# Patient Record
Sex: Male | Born: 1960 | Race: Black or African American | Hispanic: No | Smoking: Current some day smoker
Health system: Southern US, Community
[De-identification: ages and names within clinical notes are randomized; demographics above are authoritative.]

## PROBLEM LIST (undated history)

## (undated) DIAGNOSIS — E119 Type 2 diabetes mellitus without complications: Secondary | ICD-10-CM

## (undated) DIAGNOSIS — F32A Depression, unspecified: Secondary | ICD-10-CM

## (undated) DIAGNOSIS — I10 Essential (primary) hypertension: Secondary | ICD-10-CM

## (undated) DIAGNOSIS — G4730 Sleep apnea, unspecified: Secondary | ICD-10-CM

## (undated) HISTORY — PX: HAND SURGERY: SHX662

## (undated) HISTORY — PX: HEMORRHOID SURGERY: SHX153

---

## 2017-11-25 ENCOUNTER — Encounter (HOSPITAL_COMMUNITY): Payer: Self-pay | Admitting: Emergency Medicine

## 2017-11-25 ENCOUNTER — Other Ambulatory Visit: Payer: Self-pay

## 2017-11-25 ENCOUNTER — Emergency Department (HOSPITAL_COMMUNITY)
Admission: EM | Admit: 2017-11-25 | Discharge: 2017-11-26 | Disposition: A | Discharge status: Home / Self Care | Payer: Medicaid Other | Attending: Emergency Medicine | Admitting: Emergency Medicine

## 2017-11-25 DIAGNOSIS — I10 Essential (primary) hypertension: Secondary | ICD-10-CM | POA: Diagnosis not present

## 2017-11-25 DIAGNOSIS — R112 Nausea with vomiting, unspecified: Secondary | ICD-10-CM | POA: Diagnosis present

## 2017-11-25 DIAGNOSIS — R197 Diarrhea, unspecified: Secondary | ICD-10-CM | POA: Diagnosis not present

## 2017-11-25 DIAGNOSIS — F17200 Nicotine dependence, unspecified, uncomplicated: Secondary | ICD-10-CM | POA: Insufficient documentation

## 2017-11-25 DIAGNOSIS — E119 Type 2 diabetes mellitus without complications: Secondary | ICD-10-CM | POA: Insufficient documentation

## 2017-11-25 HISTORY — DX: Essential (primary) hypertension: I10

## 2017-11-25 HISTORY — DX: Type 2 diabetes mellitus without complications: E11.9

## 2017-11-25 HISTORY — DX: Sleep apnea, unspecified: G47.30

## 2017-11-25 LAB — URINALYSIS, ROUTINE W REFLEX MICROSCOPIC
BILIRUBIN URINE: NEGATIVE
Glucose, UA: 50 mg/dL — AB
HGB URINE DIPSTICK: NEGATIVE
Ketones, ur: 5 mg/dL — AB
LEUKOCYTES UA: NEGATIVE
Nitrite: NEGATIVE
PH: 5 (ref 5.0–8.0)
Protein, ur: 100 mg/dL — AB
SPECIFIC GRAVITY, URINE: 1.035 — AB (ref 1.005–1.030)

## 2017-11-25 LAB — COMPREHENSIVE METABOLIC PANEL
ALK PHOS: 82 U/L (ref 38–126)
ALT: 37 U/L (ref 17–63)
AST: 49 U/L — AB (ref 15–41)
Albumin: 3.8 g/dL (ref 3.5–5.0)
Anion gap: 14 (ref 5–15)
BILIRUBIN TOTAL: 0.8 mg/dL (ref 0.3–1.2)
BUN: 17 mg/dL (ref 6–20)
CALCIUM: 8.4 mg/dL — AB (ref 8.9–10.3)
CO2: 21 mmol/L — ABNORMAL LOW (ref 22–32)
CREATININE: 1.18 mg/dL (ref 0.61–1.24)
Chloride: 100 mmol/L — ABNORMAL LOW (ref 101–111)
Glucose, Bld: 276 mg/dL — ABNORMAL HIGH (ref 65–99)
Potassium: 3.5 mmol/L (ref 3.5–5.1)
Sodium: 135 mmol/L (ref 135–145)
Total Protein: 8 g/dL (ref 6.5–8.1)

## 2017-11-25 LAB — CBC
HCT: 47.6 % (ref 39.0–52.0)
Hemoglobin: 16.1 g/dL (ref 13.0–17.0)
MCH: 32.5 pg (ref 26.0–34.0)
MCHC: 33.8 g/dL (ref 30.0–36.0)
MCV: 96.2 fL (ref 78.0–100.0)
PLATELETS: 231 10*3/uL (ref 150–400)
RBC: 4.95 MIL/uL (ref 4.22–5.81)
RDW: 12.7 % (ref 11.5–15.5)
WBC: 9.8 10*3/uL (ref 4.0–10.5)

## 2017-11-25 LAB — LIPASE, BLOOD: Lipase: 30 U/L (ref 11–51)

## 2017-11-25 MED ORDER — ONDANSETRON 4 MG PO TBDP
8.0000 mg | ORAL_TABLET | Freq: Once | ORAL | Status: AC
  Administered 2017-11-26: 8 mg via ORAL
  Filled 2017-11-25: qty 2

## 2017-11-25 NOTE — ED Provider Notes (Signed)
Patient placed in Quick Look pathway, seen and evaluated   Chief Complaint: nausea, vomiting, abdominal pain, diarrhea  HPI:   Pt reports two days of nausea, vomiting, diarrhea, abdominal pain. Unable to keep anything down. Tried theraflu, but states he threw that up too. No sick contacts. Reports subjective fever and chills. No urinary symptoms. No URI symptoms.   ROS: positive for abd pain, nausea, vomiting, diarrhea, chills.   Physical Exam:   Gen: No distress  Neuro: Awake and Alert  Skin: Warm    Focused Exam: diffuse abd tenderness, worse in epigastric area and rid mid quadrant.   Pt with n/v/d/abdominal pain. Pt is tachycardic, appears dry. Abdomen diffusely tender, but no guarding or rebound tenderness. Labs ordered. Urine ordered. Will give zofran for now, until able to be re evaluated in the room.   Vitals:   11/25/17 1929  BP: (!) 150/98  Pulse: (!) 113  Resp: 20  Temp: 99.2 F (37.3 C)  TempSrc: Oral  SpO2: 97%  Weight: (!) 144.7 kg (319 lb)  Height: 5\' 8"  (1.727 m)      Initiation of care has begun. The patient has been counseled on the process, plan, and necessity for staying for the completion/evaluation, and the remainder of the medical screening examination    Iona CoachKirichenko, Kely Dohn, PA-C 11/25/17 Bennye [id number]    Campos, Kevin, MD 11/25/17 2251

## 2017-11-25 NOTE — ED Triage Notes (Signed)
Pt c/o nausea/vomiting/diarrhea and RLQ pain since Saturday. Reports he has been unable to keep anything down.

## 2017-11-26 MED ORDER — SODIUM CHLORIDE 0.9 % IV BOLUS (SEPSIS)
1000.0000 mL | Freq: Once | INTRAVENOUS | Status: AC
  Administered 2017-11-26: 1000 mL via INTRAVENOUS

## 2017-11-26 MED ORDER — LOPERAMIDE HCL 2 MG PO CAPS
4.0000 mg | ORAL_CAPSULE | Freq: Once | ORAL | Status: AC
  Administered 2017-11-26: 4 mg via ORAL
  Filled 2017-11-26: qty 2

## 2017-11-26 MED ORDER — ONDANSETRON HCL 4 MG/2ML IJ SOLN
4.0000 mg | Freq: Once | INTRAMUSCULAR | Status: AC
  Administered 2017-11-26: 4 mg via INTRAVENOUS
  Filled 2017-11-26: qty 2

## 2017-11-26 MED ORDER — ONDANSETRON 4 MG PO TBDP: 4.0000 mg | ORAL_TABLET | Freq: Three times a day (TID) | ORAL | 0 refills | Status: DC | PRN

## 2017-11-26 MED ORDER — LOPERAMIDE HCL 2 MG PO CAPS: 2.0000 mg | ORAL_CAPSULE | Freq: Four times a day (QID) | ORAL | 0 refills | Status: DC | PRN

## 2017-11-26 NOTE — ED Provider Notes (Signed)
[facility] EMERGENCY DEPARTMENT Provider Note   CSN: [id number] Arrival date & time: 11/25/17  1834     History   Chief Complaint Chief Complaint  Patient presents with  . Emesis    HPI Jesse Whitehead is a 57 y.o. male.  HPI  This is a 57 year old male with a history of diabetes, hypertension who presents with nausea, vomiting, diarrhea.  Patient reports onset of symptoms on Saturday.  He reports diffuse crampy abdominal pain.  He reports nonbilious, nonbloody emesis and nonbloody diarrhea.  He states "I cannot keep anything down either vomited up or it comes out the bottom."  He cannot quantify his abdominal pain he states "it is just crampy."  He denies any recent fevers, upper respiratory symptoms, cough, chest pain, shortness of breath.  No known sick contacts.  He does have a history of cholecystectomy.  Past Medical History:  Diagnosis Date  . Diabetes mellitus without complication (HCC)   . Hypertension   . Sleep apnea     There are no active problems to display for this patient.   History reviewed. No pertinent surgical history.     Home Medications    Prior to Admission medications   Medication Sig Start Date End Date Taking? Authorizing Provider  loperamide (IMODIUM) 2 MG capsule Take 1 capsule (2 mg total) by mouth 4 (four) times daily as needed for diarrhea or loose stools. 11/26/17   Denys Salinger, Mayer Masker, MD  ondansetron (ZOFRAN ODT) 4 MG disintegrating tablet Take 1 tablet (4 mg total) by mouth every 8 (eight) hours as needed for nausea or vomiting. 11/26/17   Ethel Veronica, Mayer Masker, MD    Family History No family history on file.  Social History Social History   Tobacco Use  . Smoking status: Current Every Day Smoker  . Smokeless tobacco: Never Used  Substance Use Topics  . Alcohol use: Yes    Frequency: Never  . Drug use: No     Allergies   Patient has no known allergies.   Review of Systems Review of Systems    Constitutional: Negative for fever.  HENT: Negative for congestion.   Respiratory: Negative for shortness of breath.   Cardiovascular: Negative for chest pain.  Gastrointestinal: Positive for abdominal pain, diarrhea, nausea and vomiting.  Genitourinary: Negative for dysuria.  Neurological: Negative for headaches.  All other systems reviewed and are negative.    Physical Exam Updated Vital Signs BP (!) 135/91 (BP Location: Right Arm)   Pulse (!) 104   Temp 99.2 F (37.3 C) (Oral)   Resp 20   Ht 5\' 8"  (1.727 m)   Wt (!) 144.7 kg (319 lb)   SpO2 97%   BMI 48.50 kg/m   Physical Exam  Constitutional: He is oriented to person, place, and time. He appears well-developed and well-nourished.  Obese, no acute distress  HENT:  Head: Normocephalic and atraumatic.  Mucous membranes dry  Cardiovascular: Normal rate, regular rhythm and normal heart sounds.  No murmur heard. Pulmonary/Chest: Effort normal and breath sounds normal. No respiratory distress. He has no wheezes.  Abdominal: Soft. There is tenderness. There is no rebound and no guarding.  Hyperactive bowel sounds, mild diffuse tenderness to palpation, no localized tenderness  Musculoskeletal: He exhibits no edema.  Neurological: He is alert and oriented to person, place, and time.  Skin: Skin is warm and dry.  Psychiatric: He has a normal mood and affect.  Nursing note and vitals reviewed.    ED Treatments /  Results  Labs (all labs ordered are listed, but only abnormal results are displayed) Labs Reviewed  COMPREHENSIVE METABOLIC PANEL - Abnormal; Notable for the following components:      Result Value   Chloride 100 (*)    CO2 21 (*)    Glucose, Bld 276 (*)    Calcium 8.4 (*)    AST 49 (*)    All other components within normal limits  URINALYSIS, ROUTINE W REFLEX MICROSCOPIC - Abnormal; Notable for the following components:   Color, Urine AMBER (*)    APPearance HAZY (*)    Specific Gravity, Urine 1.035 (*)     Glucose, UA 50 (*)    Ketones, ur 5 (*)    Protein, ur 100 (*)    Bacteria, UA RARE (*)    Squamous Epithelial / LPF 0-5 (*)    All other components within normal limits  LIPASE, BLOOD  CBC    EKG  EKG Interpretation None       Radiology No results found.  Procedures Procedures (including critical care time)  Medications Ordered in ED Medications  ondansetron (ZOFRAN-ODT) disintegrating tablet 8 mg (8 mg Oral Given 11/26/17 0106)  ondansetron (ZOFRAN) injection 4 mg (4 mg Intravenous Given 11/26/17 0209)  loperamide (IMODIUM) capsule 4 mg (4 mg Oral Given 11/26/17 0210)  sodium chloride 0.9 % bolus 1,000 mL (1,000 mLs Intravenous New Bag/Given 11/26/17 0125)     Initial Impression / Assessment and Plan / ED Course  I have reviewed the triage vital signs and the nursing notes.  Pertinent labs & imaging results that were available during my care of the patient were reviewed by me and considered in my medical decision making (see chart for details).     She presents with nausea, vomiting, diarrhea.  He is overall nontoxic appearing.  Vital signs notable for heart rate of 104 and blood pressure 135/91.  He does appear somewhat dry.  Physical exam is fairly benign.  He has some mild diffuse tenderness but no signs of peritonitis.  Suspect viral etiology.  He has had prior abdominal surgeries that would put him at risk for bowel obstruction; however he is passing stools and he is nontender on exam.  Lab workup is largely unremarkable.  He does have some hyperglycemia.  No evidence of DKA.  Patient given fluids, Zofran, and Imodium.  On recheck, he states he feels better and is tolerating fluids without difficulty.  Suspect viral etiology.  Supportive measures recommended at home.  After history, exam, and medical workup I feel the patient has been appropriately medically screened and is safe for discharge home. Pertinent diagnoses were discussed with the patient. Patient was given  return precautions.   Final Clinical Impressions(s) / ED Diagnoses   Final diagnoses:  Nausea vomiting and diarrhea    ED Discharge Orders        Ordered    ondansetron (ZOFRAN ODT) 4 MG disintegrating tablet  Every 8 hours PRN     11/26/17 0230    loperamide (IMODIUM) 2 MG capsule  4 times daily PRN     11/26/17 0230       Shon BatonHorton, Colandra Ohanian F, MD 11/26/17 0234

## 2018-12-11 ENCOUNTER — Emergency Department (HOSPITAL_COMMUNITY): Payer: Medicaid Other

## 2018-12-11 ENCOUNTER — Emergency Department (HOSPITAL_COMMUNITY)
Admission: EM | Admit: 2018-12-11 | Discharge: 2018-12-11 | Disposition: A | Discharge status: Home / Self Care | Payer: Medicaid Other | Attending: Emergency Medicine | Admitting: Emergency Medicine

## 2018-12-11 ENCOUNTER — Other Ambulatory Visit: Payer: Self-pay

## 2018-12-11 ENCOUNTER — Encounter (HOSPITAL_COMMUNITY): Payer: Self-pay

## 2018-12-11 DIAGNOSIS — R109 Unspecified abdominal pain: Secondary | ICD-10-CM | POA: Insufficient documentation

## 2018-12-11 DIAGNOSIS — F17210 Nicotine dependence, cigarettes, uncomplicated: Secondary | ICD-10-CM | POA: Diagnosis not present

## 2018-12-11 DIAGNOSIS — I10 Essential (primary) hypertension: Secondary | ICD-10-CM | POA: Diagnosis not present

## 2018-12-11 DIAGNOSIS — E119 Type 2 diabetes mellitus without complications: Secondary | ICD-10-CM | POA: Insufficient documentation

## 2018-12-11 LAB — CBC WITH DIFFERENTIAL/PLATELET
ABS IMMATURE GRANULOCYTES: 0.03 10*3/uL (ref 0.00–0.07)
Basophils Absolute: 0 10*3/uL (ref 0.0–0.1)
Basophils Relative: 0 %
EOS ABS: 0.1 10*3/uL (ref 0.0–0.5)
Eosinophils Relative: 1 %
HEMATOCRIT: 48.7 % (ref 39.0–52.0)
Hemoglobin: 15.8 g/dL (ref 13.0–17.0)
IMMATURE GRANULOCYTES: 0 %
LYMPHS ABS: 2.6 10*3/uL (ref 0.7–4.0)
Lymphocytes Relative: 23 %
MCH: 30.3 pg (ref 26.0–34.0)
MCHC: 32.4 g/dL (ref 30.0–36.0)
MCV: 93.5 fL (ref 80.0–100.0)
Monocytes Absolute: 1.1 10*3/uL — ABNORMAL HIGH (ref 0.1–1.0)
Monocytes Relative: 10 %
NEUTROS ABS: 7.3 10*3/uL (ref 1.7–7.7)
NEUTROS PCT: 66 %
Platelets: 221 10*3/uL (ref 150–400)
RBC: 5.21 MIL/uL (ref 4.22–5.81)
RDW: 12.4 % (ref 11.5–15.5)
WBC: 11.1 10*3/uL — AB (ref 4.0–10.5)
nRBC: 0 % (ref 0.0–0.2)

## 2018-12-11 LAB — COMPREHENSIVE METABOLIC PANEL
ALBUMIN: 3.8 g/dL (ref 3.5–5.0)
ALT: 15 U/L (ref 0–44)
AST: 19 U/L (ref 15–41)
Alkaline Phosphatase: 70 U/L (ref 38–126)
Anion gap: 8 (ref 5–15)
BILIRUBIN TOTAL: 0.8 mg/dL (ref 0.3–1.2)
BUN: 9 mg/dL (ref 6–20)
CALCIUM: 9.6 mg/dL (ref 8.9–10.3)
CO2: 27 mmol/L (ref 22–32)
CREATININE: 1 mg/dL (ref 0.61–1.24)
Chloride: 100 mmol/L (ref 98–111)
GFR calc Af Amer: >60 mL/min (ref >60)
GLUCOSE: 199 mg/dL — AB (ref 70–99)
POTASSIUM: 4 mmol/L (ref 3.5–5.1)
Sodium: 135 mmol/L (ref 135–145)
TOTAL PROTEIN: 7.5 g/dL (ref 6.5–8.1)

## 2018-12-11 LAB — URINALYSIS, ROUTINE W REFLEX MICROSCOPIC
BILIRUBIN URINE: NEGATIVE
GLUCOSE, UA: 50 mg/dL — AB
Hgb urine dipstick: NEGATIVE
KETONES UR: NEGATIVE mg/dL
LEUKOCYTE UA: NEGATIVE
Nitrite: NEGATIVE
PH: 5 (ref 5.0–8.0)
PROTEIN: NEGATIVE mg/dL
Specific Gravity, Urine: 1.021 (ref 1.005–1.030)

## 2018-12-11 LAB — LIPASE, BLOOD: LIPASE: 27 U/L (ref 11–51)

## 2018-12-11 MED ORDER — MORPHINE SULFATE (PF) 4 MG/ML IV SOLN
4.0000 mg | Freq: Once | INTRAVENOUS | Status: AC
  Administered 2018-12-11: 4 mg via INTRAVENOUS
  Filled 2018-12-11: qty 1

## 2018-12-11 MED ORDER — SODIUM CHLORIDE 0.9 % IV BOLUS
1000.0000 mL | Freq: Once | INTRAVENOUS | Status: AC
  Administered 2018-12-11: 1000 mL via INTRAVENOUS

## 2018-12-11 MED ORDER — HYDROCODONE-ACETAMINOPHEN 5-325 MG PO TABS: 1.0000 | ORAL_TABLET | ORAL | 0 refills | Status: DC | PRN

## 2018-12-11 MED ORDER — ONDANSETRON HCL 4 MG/2ML IJ SOLN
4.0000 mg | Freq: Once | INTRAMUSCULAR | Status: AC
  Administered 2018-12-11: 4 mg via INTRAVENOUS
  Filled 2018-12-11: qty 2

## 2018-12-11 MED ORDER — DIAZEPAM 5 MG PO TABS: 5.0000 mg | ORAL_TABLET | Freq: Two times a day (BID) | ORAL | 0 refills | Status: DC

## 2018-12-11 MED ORDER — HYDROMORPHONE HCL 1 MG/ML IJ SOLN
1.0000 mg | Freq: Once | INTRAMUSCULAR | Status: AC
  Administered 2018-12-11: 1 mg via INTRAVENOUS
  Filled 2018-12-11: qty 1

## 2018-12-11 NOTE — ED Notes (Signed)
Patient returned back from CT. 

## 2018-12-11 NOTE — ED Provider Notes (Signed)
[facility] EMERGENCY DEPARTMENT Provider Note   CSN: [id number] Arrival date & time: 12/11/18  6578    History   Chief Complaint Chief Complaint  Patient presents with  . Flank Pain    HPI Esther Broyles is a 58 y.o. male.     Pt presents to the ED today with right sided flank pain.  Pt said it started last night. No n/v.  He said it feels like his gallbladder, but that is gone.  No hx of kidney stones.     Past Medical History:  Diagnosis Date  . Diabetes mellitus without complication (HCC)   . Hypertension   . Sleep apnea     There are no active problems to display for this patient.   History reviewed. No pertinent surgical history.      Home Medications    Prior to Admission medications   Medication Sig Start Date End Date Taking? Authorizing Provider  diazepam (VALIUM) 5 MG tablet Take 1 tablet (5 mg total) by mouth 2 (two) times daily. 12/11/18   Jacalyn Lefevre, MD  HYDROcodone-acetaminophen (NORCO/VICODIN) 5-325 MG tablet Take 1 tablet by mouth every 4 (four) hours as needed. 12/11/18   Jacalyn Lefevre, MD  loperamide (IMODIUM) 2 MG capsule Take 1 capsule (2 mg total) by mouth 4 (four) times daily as needed for diarrhea or loose stools. 11/26/17   Horton, Mayer Masker, MD  ondansetron (ZOFRAN ODT) 4 MG disintegrating tablet Take 1 tablet (4 mg total) by mouth every 8 (eight) hours as needed for nausea or vomiting. 11/26/17   Horton, Mayer Masker, MD    Family History History reviewed. No pertinent family history.  Social History Social History   Tobacco Use  . Smoking status: Current Every Day Smoker    Packs/day: 0.25    Types: Cigarettes  . Smokeless tobacco: Never Used  Substance Use Topics  . Alcohol use: Yes    Frequency: Never    Comment: occ  . Drug use: No     Allergies   Patient has no known allergies.   Review of Systems Review of Systems  Genitourinary: Positive for flank pain.  All other systems reviewed and  are negative.    Physical Exam Updated Vital Signs BP (!) 148/92 (BP Location: Right Arm)   Pulse 92   Temp 98.4 F (36.9 C) (Oral)   Resp 20   Ht  (1.727 m)   Wt 136.1 kg   SpO2 95%   BMI 45.61 kg/m   Physical Exam Vitals signs and nursing note reviewed.  Constitutional:      Appearance: Normal appearance. He is obese.  HENT:     Head: Normocephalic and atraumatic.     Right Ear: External ear normal.     Left Ear: External ear normal.     Nose: Nose normal.     Mouth/Throat:     Mouth: Mucous membranes are moist.  Eyes:     Extraocular Movements: Extraocular movements intact.     Pupils: Pupils are equal, round, and reactive to light.  Neck:     Musculoskeletal: Normal range of motion and neck supple.  Cardiovascular:     Rate and Rhythm: Normal rate and regular rhythm.     Pulses: Normal pulses.  Pulmonary:     Effort: Pulmonary effort is normal.     Breath sounds: Normal breath sounds.  Abdominal:     General: Abdomen is flat.     Palpations: Abdomen is soft.  Tenderness: There is abdominal tenderness in the right upper quadrant and right lower quadrant.  Musculoskeletal: Normal range of motion.  Skin:    General: Skin is warm.     Capillary Refill: Capillary refill takes less than 2 seconds.  Neurological:     General: No focal deficit present.     Mental Status: He is alert and oriented to person, place, and time.  Psychiatric:        Mood and Affect: Mood normal.        Behavior: Behavior normal.      ED Treatments / Results  Labs (all labs ordered are listed, but only abnormal results are displayed) Labs Reviewed  CBC WITH DIFFERENTIAL/PLATELET - Abnormal; Notable for the following components:      Result Value   WBC 11.1 (*)    Monocytes Absolute 1.1 (*)    All other components within normal limits  COMPREHENSIVE METABOLIC PANEL - Abnormal; Notable for the following components:   Glucose, Bld 199 (*)    All other components within  normal limits  URINALYSIS, ROUTINE W REFLEX MICROSCOPIC - Abnormal; Notable for the following components:   Glucose, UA 50 (*)    All other components within normal limits  LIPASE, BLOOD    EKG None  Radiology Ct Renal Stone Study  Result Date: 12/11/2018 CLINICAL DATA:  Right flank pain since last night. EXAM: CT ABDOMEN AND PELVIS WITHOUT CONTRAST TECHNIQUE: Multidetector CT imaging of the abdomen and pelvis was performed following the standard protocol without IV contrast. COMPARISON:  None. FINDINGS: Lower chest: No acute abnormality. Minimal subsegmental atelectasis in the left lower lobe. Hepatobiliary: Hepatic steatosis. No focal liver abnormality. Status post cholecystectomy. No biliary dilatation. Pancreas: Unremarkable. No pancreatic ductal dilatation or surrounding inflammatory changes. Spleen: Normal in size without focal abnormality. Adrenals/Urinary Tract: The adrenal glands are unremarkable. 1.2 cm exophytic simple cyst arising from the upper pole of the right kidney. Punctate bilateral renal calculi. No ureteral calculi. No hydronephrosis. The bladder is decompressed. Stomach/Bowel: Stomach is within normal limits. Appendix appears normal. No evidence of bowel wall thickening, distention, or inflammatory changes. Vascular/Lymphatic: Mild aortic atherosclerosis. No enlarged abdominal or pelvic lymph nodes. Reproductive: Prostate is unremarkable. Other: Tiny supraumbilical ventral hernia containing fat. No free fluid or pneumoperitoneum. Musculoskeletal: No acute or significant osseous findings. IMPRESSION: 1.  No acute intra-abdominal process. 2. Bilateral punctate nonobstructive nephrolithiasis. 3. Hepatic steatosis. 4.  Aortic atherosclerosis (ICD10-I70.0). Electronically Signed   By: Obie Dredge M.D.   On: 12/11/2018 09:50    Procedures Procedures (including critical care time)  Medications Ordered in ED Medications  morphine 4 MG/ML injection 4 mg (4 mg Intravenous Given  12/11/18 0920)  ondansetron (ZOFRAN) injection 4 mg (4 mg Intravenous Given 12/11/18 0920)  sodium chloride 0.9 % bolus 1,000 mL (1,000 mLs Intravenous New Bag/Given 12/11/18 0922)  HYDROmorphone (DILAUDID) injection 1 mg (1 mg Intravenous Given 12/11/18 1005)     Initial Impression / Assessment and Plan / ED Course  I have reviewed the triage vital signs and the nursing notes.  Pertinent labs & imaging results that were available during my care of the patient were reviewed by me and considered in my medical decision making (see chart for details).       Pain is much improved.  The pt recalls the pain starting after getting up from the porch.  He denies any lifting or moving anything heavy.  Labs and CT ok other than glucose 199, but he is a known  diabetic.  He knows to return if worse.  F/u with pcp.  Final Clinical Impressions(s) / ED Diagnoses   Final diagnoses:  Flank pain    ED Discharge Orders         Ordered    HYDROcodone-acetaminophen (NORCO/VICODIN) 5-325 MG tablet  Every 4 hours PRN     12/11/18 1120    diazepam (VALIUM) 5 MG tablet  2 times daily     12/11/18 1120           Jacalyn Lefevre, MD 12/11/18 1121

## 2018-12-11 NOTE — ED Notes (Signed)
Pt verbalized understanding of d/c instructions and has no further questions, VSS, NAD. Pt d/c home with family driving.  

## 2018-12-11 NOTE — ED Triage Notes (Signed)
Pt endorses right sided flank pain since yesterday evening. Denies n/v/d or hematuria. Pt states that he is urinating  Less than normal.

## 2019-03-14 ENCOUNTER — Emergency Department (HOSPITAL_COMMUNITY)
Admission: EM | Admit: 2019-03-14 | Discharge: 2019-03-14 | Disposition: A | Discharge status: Home / Self Care | Payer: Medicaid Other | Attending: Emergency Medicine | Admitting: Emergency Medicine

## 2019-03-14 ENCOUNTER — Other Ambulatory Visit: Payer: Self-pay

## 2019-03-14 DIAGNOSIS — E119 Type 2 diabetes mellitus without complications: Secondary | ICD-10-CM | POA: Diagnosis not present

## 2019-03-14 DIAGNOSIS — Z20828 Contact with and (suspected) exposure to other viral communicable diseases: Secondary | ICD-10-CM | POA: Diagnosis not present

## 2019-03-14 DIAGNOSIS — F17210 Nicotine dependence, cigarettes, uncomplicated: Secondary | ICD-10-CM | POA: Diagnosis not present

## 2019-03-14 DIAGNOSIS — Z20822 Contact with and (suspected) exposure to covid-19: Secondary | ICD-10-CM

## 2019-03-14 DIAGNOSIS — E860 Dehydration: Secondary | ICD-10-CM | POA: Diagnosis not present

## 2019-03-14 DIAGNOSIS — R509 Fever, unspecified: Secondary | ICD-10-CM | POA: Diagnosis present

## 2019-03-14 DIAGNOSIS — I10 Essential (primary) hypertension: Secondary | ICD-10-CM | POA: Insufficient documentation

## 2019-03-14 LAB — CBC WITH DIFFERENTIAL/PLATELET
Abs Immature Granulocytes: 0.03 10*3/uL (ref 0.00–0.07)
Basophils Absolute: 0 10*3/uL (ref 0.0–0.1)
Basophils Relative: 0 %
Eosinophils Absolute: 0 10*3/uL (ref 0.0–0.5)
Eosinophils Relative: 0 %
HCT: 49.1 % (ref 39.0–52.0)
Hemoglobin: 16.3 g/dL (ref 13.0–17.0)
Immature Granulocytes: 0 %
Lymphocytes Relative: 20 %
Lymphs Abs: 1.9 10*3/uL (ref 0.7–4.0)
MCH: 31.4 pg (ref 26.0–34.0)
MCHC: 33.2 g/dL (ref 30.0–36.0)
MCV: 94.6 fL (ref 80.0–100.0)
Monocytes Absolute: 0.9 10*3/uL (ref 0.1–1.0)
Monocytes Relative: 9 %
Neutro Abs: 6.8 10*3/uL (ref 1.7–7.7)
Neutrophils Relative %: 71 %
Platelets: 244 10*3/uL (ref 150–400)
RBC: 5.19 MIL/uL (ref 4.22–5.81)
RDW: 12.9 % (ref 11.5–15.5)
WBC: 9.7 10*3/uL (ref 4.0–10.5)
nRBC: 0 % (ref 0.0–0.2)

## 2019-03-14 LAB — COMPREHENSIVE METABOLIC PANEL
ALT: 19 U/L (ref 0–44)
AST: 21 U/L (ref 15–41)
Albumin: 3.8 g/dL (ref 3.5–5.0)
Alkaline Phosphatase: 77 U/L (ref 38–126)
Anion gap: 9 (ref 5–15)
BUN: 14 mg/dL (ref 6–20)
CO2: 25 mmol/L (ref 22–32)
Calcium: 9.5 mg/dL (ref 8.9–10.3)
Chloride: 104 mmol/L (ref 98–111)
Creatinine, Ser: 1.41 mg/dL — ABNORMAL HIGH (ref 0.61–1.24)
GFR calc Af Amer: >60 mL/min (ref >60)
GFR calc non Af Amer: 55 mL/min — ABNORMAL LOW (ref >60)
Glucose, Bld: 196 mg/dL — ABNORMAL HIGH (ref 70–99)
Potassium: 4.1 mmol/L (ref 3.5–5.1)
Sodium: 138 mmol/L (ref 135–145)
Total Bilirubin: 0.9 mg/dL (ref 0.3–1.2)
Total Protein: 7.1 g/dL (ref 6.5–8.1)

## 2019-03-14 MED ORDER — SODIUM CHLORIDE 0.9 % IV BOLUS: 500.0000 mL | Freq: Once | INTRAVENOUS | Status: DC

## 2019-03-14 NOTE — Discharge Instructions (Addendum)
You have been tested for the coronavirus today. Results should return in 24-48 hours. You should quarantine until results have returned.  If positive, you will receive a phone call. If negative, you will not. Either way, you may check online on MyChart.  Make sure you are drinking lots of fluids. Your urine should be clear to pale yellow.  Return to the emergency room if you develop increased difficulty breathing, shortness of breath, or any new, worsening, or concerning symptoms.

## 2019-03-14 NOTE — ED Provider Notes (Signed)
[facility]ERGENCY [facility]PART[facility] Provider Note   CSN: [id number] Arrival date & time: 03/14/19  1603    History   Chief Complaint Chief Complaint  Patient presents with  . Fever    HPI Jesse Whitehead is a 58 y.o. male presenting for covid testing.   Pt states his roommate has been sick for 4 days, he was seen today and tested positive for covid. As such, pt would like to be tested. Pt states he has intermittent hot and cold feeling, which is not abnormal for him. He reports intermittent nasal congestion, which is more noticible after using his CPAP at night, also baseline for him. He denies known fever, chills, ST, loss of taste/smell, cp, sob, n/v, abd pain, urinary sxs, or abnormal BMs.  Patient reports a history of diabetes, blood sugar has been as baseline.  Additional history of hypertension.  His PCP is with the [facility].     HPI  Past Medical History:  Diagnosis Date  . Diabetes mellitus without complication (HCC)   . Hypertension   . Sleep apnea     There are no active problems to display for this patient.   No past surgical history on file.      Home Medications    Prior to Admission medications   Medication Sig Start Date End Date Taking? Authorizing Provider  diazepam (VALIUM) 5 MG tablet Take 1 tablet (5 mg total) by mouth 2 (two) times daily. 12/11/18   Jacalyn LefevreHaviland, Julie, MD  HYDROcodone-acetaminophen (NORCO/VICODIN) 5-325 MG tablet Take 1 tablet by mouth every 4 (four) hours as needed. 12/11/18   Jacalyn LefevreHaviland, Julie, MD  loperamide (IMODIUM) 2 MG capsule Take 1 capsule (2 mg total) by mouth 4 (four) times daily as needed for diarrhea or loose stools. 11/26/17   Horton, Mayer Maskerourtney F, MD  ondansetron (ZOFRAN ODT) 4 MG disintegrating tablet Take 1 tablet (4 mg total) by mouth every 8 (eight) hours as needed for nausea or vomiting. 11/26/17   Horton, Mayer Maskerourtney F, MD    Family History No family history on file.  Social History Social History   Tobacco  Use  . Smoking status: Current Every Day Smoker    Packs/day: 0.25    Types: Cigarettes  . Smokeless tobacco: Never Used  Substance Use Topics  . Alcohol use: Yes    Frequency: Never    Comment: occ  . Drug use: No     Allergies   Patient has no known allergies.   Review of Systems Review of Systems  All other systems reviewed and are negative.    Physical Exam Updated Vital Signs BP (!) 127/96 (BP Location: Right Arm)   Pulse 98  Temp 99.2 F (37.3 C) (Oral)   Ht 5\' 8"  (1.727 m)   Wt 131.1 kg   SpO2 96%   BMI 43.94 kg/m   Physical Exam Vitals signs and nursing note reviewed.  Constitutional:      General: He is not in acute distress.    Appearance: He is well-developed.     Comments: Obese male resting comfortably in the bed in no acute distress  HENT:     Head: Normocephalic and atraumatic.  Eyes:     Conjunctiva/sclera: Conjunctivae normal.     Pupils: Pupils are equal, round, and reactive to light.  Neck:     Musculoskeletal: Normal range of motion and neck supple.  Cardiovascular:     Rate and Rhythm: Normal rate and regular rhythm.     Pulses:  Normal pulses.     Comments: On my exam, patient is not tachycardic, heart rate 98. Pulmonary:     Effort: Pulmonary effort is normal. No respiratory distress.     Breath sounds: Normal breath sounds. No wheezing.     Comments: Speaking in full sentences.  Clear lung sounds in all fields. Abdominal:     General: There is no distension.     Palpations: Abdomen is soft. There is no mass.     Tenderness: There is no abdominal tenderness. There is no guarding or rebound.  Musculoskeletal: Normal range of motion.  Skin:    General: Skin is warm and dry.     Capillary Refill: Capillary refill takes less than 2 seconds.  Neurological:     Mental Status: He is alert and oriented to person, place, and time.      ED Treatments / Results  Labs (all labs ordered are listed, but only abnormal results are  displayed) Labs Reviewed  COMPREHENSIVE METABOLIC PANEL - Abnormal; Notable for the following components:      Result Value   Glucose, Bld 196 (*)    Creatinine, Ser 1.41 (*)    GFR calc non Af Amer 55 (*)    All other components within normal limits  NOVEL CORONAVIRUS, NAA (HOSPITAL ORDER, SEND-OUT TO REF LAB)  CBC WITH DIFFERENTIAL/PLATELET    EKG None  Radiology No results found.  Procedures Procedures (including critical care time)  Medications Ordered in ED Medications - No data to display   Initial Impression / Assessment and Plan / ED Course  I have reviewed the triage vital signs and the nursing notes.  Pertinent labs & imaging results that were available during my care of the patient were reviewed by me and considered in my medical decision making (see chart for details).        Patient presenting for coronavirus testing since his roommate recently tested positive.  On exam, patient appears nontoxic.  No respiratory distress.  Pulmonary exam reassuring.  Patient was initially noted to be tachycardic, however on my exam, this has normalized.  Patient was without any new symptoms.  As such, I do not believe he needs significant work-up or hospitalization.  Will order send out coronavirus test.  Blood work drawn prior to my evaluation.  CBC without leukocytosis.  CMP shows mildly elevated creatine, likely dehydration in the setting of tachycardia initially. discussed with pt, recommended fluid bolus. Pt declined, but states he would drink lots of water at home.. Discussed plan for send out covid testing with pt. discussed quarantine until results have returned. At this time, pt appears safe for d/c. return precautions given. Pt states he understands and agrees to plan.   Jesse Whitehead was evaluated in Emergency Department on 03/14/2019 for the symptoms described in the history of present illness. He was evaluated in the context of the global COVID-19 pandemic, which  necessitated consideration that the patient might be at risk for infection with the SARS-CoV-2 virus that causes COVID-19. Institutional protocols and algorithms that pertain to the evaluation of patients at risk for COVID-19 are in a state of rapid change based on information released by regulatory bodies including the CDC and federal and state organizations. These policies and algorithms were followed during the patient's care in the ED.   Final Clinical Impressions(s) / ED Diagnoses   Final diagnoses:  Dehydration  Close Exposure to Covid-19 Virus    ED Discharge Orders    None  Franchot Heidelberg, PA-C 03/14/19 1953    Gareth Morgan, MD 03/16/19 1529

## 2019-03-14 NOTE — ED Triage Notes (Signed)
C/o fever, chills and generalized aches and pain x 2 days ago. Reported roommate tested + for Covid 19. Pt reported hx of DM, HTN and sleep apnea.

## 2019-03-15 LAB — NOVEL CORONAVIRUS, NAA (HOSP ORDER, SEND-OUT TO REF LAB; TAT 18-24 HRS): SARS-CoV-2, NAA: NOT DETECTED

## 2020-02-29 ENCOUNTER — Other Ambulatory Visit: Payer: Self-pay

## 2020-02-29 ENCOUNTER — Encounter (HOSPITAL_COMMUNITY): Payer: Self-pay | Admitting: Emergency Medicine

## 2020-02-29 ENCOUNTER — Emergency Department (HOSPITAL_COMMUNITY)
Admission: EM | Admit: 2020-02-29 | Discharge: 2020-02-29 | Disposition: A | Discharge status: Home / Self Care | Payer: Medicaid Other | Attending: Emergency Medicine | Admitting: Emergency Medicine

## 2020-02-29 DIAGNOSIS — Y9389 Activity, other specified: Secondary | ICD-10-CM | POA: Insufficient documentation

## 2020-02-29 DIAGNOSIS — E119 Type 2 diabetes mellitus without complications: Secondary | ICD-10-CM | POA: Diagnosis not present

## 2020-02-29 DIAGNOSIS — Y999 Unspecified external cause status: Secondary | ICD-10-CM | POA: Diagnosis not present

## 2020-02-29 DIAGNOSIS — F17210 Nicotine dependence, cigarettes, uncomplicated: Secondary | ICD-10-CM | POA: Diagnosis not present

## 2020-02-29 DIAGNOSIS — K0889 Other specified disorders of teeth and supporting structures: Secondary | ICD-10-CM | POA: Diagnosis present

## 2020-02-29 DIAGNOSIS — X58XXXA Exposure to other specified factors, initial encounter: Secondary | ICD-10-CM | POA: Insufficient documentation

## 2020-02-29 DIAGNOSIS — S032XXA Dislocation of tooth, initial encounter: Secondary | ICD-10-CM | POA: Insufficient documentation

## 2020-02-29 DIAGNOSIS — I10 Essential (primary) hypertension: Secondary | ICD-10-CM | POA: Diagnosis not present

## 2020-02-29 DIAGNOSIS — Z7984 Long term (current) use of oral hypoglycemic drugs: Secondary | ICD-10-CM | POA: Diagnosis not present

## 2020-02-29 DIAGNOSIS — Y929 Unspecified place or not applicable: Secondary | ICD-10-CM | POA: Diagnosis not present

## 2020-02-29 MED ORDER — HYDROMORPHONE HCL 1 MG/ML IJ SOLN
1.0000 mg | Freq: Once | INTRAMUSCULAR | Status: AC
  Administered 2020-02-29: 1 mg via INTRAMUSCULAR
  Filled 2020-02-29: qty 1

## 2020-02-29 NOTE — ED Provider Notes (Signed)
[facility] EMERGENCY DEPARTMENT Provider Note   CSN: [id number] Arrival date & time: 02/29/20  6269     History Chief Complaint  Patient presents with  . Dental Pain    Jesse Whitehead is a 59 y.o. male.  HPI     Patient presents concern of dental pain. 2 days ago the patient bit down on something hard, possibly bone. He felt immediate onset of pain in the left anterior paramidline area. There is no broken tooth, but the canine was grossly displaced.  Since that time pain has been persistent, sharp, severe, worse with any mouth motion eating, speaking, prohibiting him from sleeping.  No relief with OTC medication. No fever, nausea, vomiting or other complaints per Patient has history of diabetes, has had multiple teeth pulled in the past, though he has not actually seen a dentist in at least 3 years. He presents with concern of severe pain in the same area.  Past Medical History:  Diagnosis Date  . Diabetes mellitus without complication (HCC)   . Hypertension   . Sleep apnea     There are no problems to display for this patient.   History reviewed. No pertinent surgical history.     No family history on file.  Social History   Tobacco Use  . Smoking status: Current Every Day Smoker    Packs/day: 0.25    Types: Cigarettes  . Smokeless tobacco: Never Used  Substance Use Topics  . Alcohol use: Yes    Comment: occ  . Drug use: No    Home Medications Prior to Admission medications   Medication Sig Start Date End Date Taking? Authorizing Provider  diazepam (VALIUM) 5 MG tablet Take 1 tablet (5 mg total) by mouth 2 (two) times daily. 12/11/18   Jacalyn Lefevre, MD  HYDROcodone-acetaminophen (NORCO/VICODIN) 5-325 MG tablet Take 1 tablet by mouth every 4 (four) hours as needed. 12/11/18   Jacalyn Lefevre, MD  loperamide (IMODIUM) 2 MG capsule Take 1 capsule (2 mg total) by mouth 4 (four) times daily as needed for diarrhea or loose stools. 11/26/17    Horton, Mayer Masker, MD  ondansetron (ZOFRAN ODT) 4 MG disintegrating tablet Take 1 tablet (4 mg total) by mouth every 8 (eight) hours as needed for nausea or vomiting. 11/26/17   Horton, Mayer Masker, MD    Allergies    Patient has no known allergies.  Review of Systems   Review of Systems  Constitutional:       Per HPI, otherwise negative  HENT:       Per HPI, otherwise negative  Respiratory:       Per HPI, otherwise negative  Cardiovascular:       Per HPI, otherwise negative  Gastrointestinal: Negative for vomiting.  Endocrine:       Negative aside from HPI  Genitourinary:       Neg aside from HPI   Musculoskeletal:       Per HPI, otherwise negative  Skin: Negative.   Neurological: Negative for headaches.    Physical Exam Updated Vital Signs BP (!) 160/100 (BP Location: Left Arm)   Pulse 95   Temp 98.2 F (36.8 C) (Oral)   Resp 20   SpO2 97%   Physical Exam Vitals and nursing note reviewed.  Constitutional:      General: He is not in acute distress.    Appearance: He is well-developed.     Comments: Uncomfortable appearing obese adult male speaking with visible discomfort.  HENT:  Head: Normocephalic and atraumatic.     Mouth/Throat:   Eyes:     Conjunctiva/sclera: Conjunctivae normal.  Pulmonary:     Effort: Pulmonary effort is normal. No respiratory distress.     Breath sounds: No stridor.  Abdominal:     General: There is no distension.  Skin:    General: Skin is warm and dry.  Neurological:     Mental Status: He is alert and oriented to person, place, and time.  Psychiatric:        Mood and Affect: Mood normal.     ED Results / Procedures / Treatments    Procedures Procedures (including critical care time)  Medications Ordered in ED Medications - No data to display  ED Course  I have reviewed the triage vital signs and the nursing notes.  Pertinent labs & imaging results that were available during my care of the patient were reviewed  by me and considered in my medical decision making (see chart for details).  Adult male with multiple medical issues including diabetes presents with partially avulsed tooth.  Evaluation reassuring in terms of concurrent issues, no evidence for infection, bleeding, discharge. However, with obvious pain, partial avulsion discussed this case with our dental colleagues to arrange for treatment this afternoon.  Patient discharged in stable condition to proceed emergently to dental follow-up.  Dr. Dayna Barker is awaiting the follow-up call. Final Clinical Impression(s) / ED Diagnoses Final diagnoses:  Tooth avulsion, initial encounter     Jesse Muskrat, MD 02/29/20 1530

## 2020-02-29 NOTE — ED Triage Notes (Signed)
Pt. Stated, I have a bad tooth and my mouth and lips swollen

## 2020-02-29 NOTE — Discharge Instructions (Signed)
Please be sure to proceed to our dental colleagues office.  Return here for concerning changes in your condition.

## 2020-02-29 NOTE — ED Notes (Signed)
Patient Alert and oriented to baseline. Stable and ambulatory to baseline. Patient verbalized understanding of the discharge instructions.  Patient belongings were taken by the patient.   

## 2020-03-01 ENCOUNTER — Telehealth: Payer: Self-pay | Admitting: *Deleted

## 2020-03-01 NOTE — Telephone Encounter (Signed)
TOC CM received call from patient complaining about the assigned dentist could not see him earlier. States he was able to locate a dentist that can see him on Thursday. He would not provide name. Wanted to voice his concerns. Explained to pt the importance of being complaint with follow up appts, taking medications as prescribed. Pt states he has been missing his appts due COVID.Isidoro Donning RN CCM, [facility] ED TOC CM [phone]

## 2020-07-12 ENCOUNTER — Other Ambulatory Visit: Payer: Self-pay

## 2020-07-12 ENCOUNTER — Ambulatory Visit (HOSPITAL_COMMUNITY)
Admission: EM | Admit: 2020-07-12 | Discharge: 2020-07-12 | Disposition: A | Discharge status: Home / Self Care | Payer: Medicaid Other | Attending: Family Medicine | Admitting: Family Medicine

## 2020-07-12 ENCOUNTER — Encounter (HOSPITAL_COMMUNITY): Payer: Self-pay | Admitting: Emergency Medicine

## 2020-07-12 DIAGNOSIS — F17210 Nicotine dependence, cigarettes, uncomplicated: Secondary | ICD-10-CM | POA: Insufficient documentation

## 2020-07-12 DIAGNOSIS — Z9049 Acquired absence of other specified parts of digestive tract: Secondary | ICD-10-CM | POA: Insufficient documentation

## 2020-07-12 DIAGNOSIS — Z20822 Contact with and (suspected) exposure to covid-19: Secondary | ICD-10-CM | POA: Insufficient documentation

## 2020-07-12 DIAGNOSIS — R079 Chest pain, unspecified: Secondary | ICD-10-CM | POA: Diagnosis not present

## 2020-07-12 DIAGNOSIS — K529 Noninfective gastroenteritis and colitis, unspecified: Secondary | ICD-10-CM | POA: Diagnosis not present

## 2020-07-12 DIAGNOSIS — R109 Unspecified abdominal pain: Secondary | ICD-10-CM | POA: Diagnosis present

## 2020-07-12 LAB — SARS CORONAVIRUS 2 (TAT 6-24 HRS): SARS Coronavirus 2: NEGATIVE

## 2020-07-12 NOTE — ED Triage Notes (Signed)
Pain started 2 days ago  in chest and right abdomen.  Patient has had diarrhea.  Last night did have vomiting.  Just the one episode. Today has had 4 episodes of diarrhea. Pain in chest with movement, pain is dull.  No pain currently

## 2020-07-12 NOTE — Discharge Instructions (Addendum)
Patient you are rehydrating with Gatorade for electrolytes, low sugar and water. Bland diet for now. For any continued or worsening problems you will need to go to the ER.  Covid swabs to be back by tomorrow

## 2020-07-12 NOTE — ED Notes (Signed)
covid swab at bedside

## 2020-07-13 NOTE — ED Provider Notes (Signed)
[facility]    CSN: [id number] Arrival date & time: 07/12/20  1214      History   Chief Complaint Chief Complaint  Patient presents with  . Chest Pain  . Abdominal Pain    HPI Jesse Whitehead is a 59 y.o. male.   Patient is a 14-year-old male with past medical history of diabetes and hypertension.  He presents today with chest pain, abdominal pain and diarrhea.  1 episode of vomiting last night.  Today has had 4 episodes of diarrhea, nonbloody.  All of his pain is with movement and described as achy.  Recently went on a trip and had feeling more of that tested positive for Covid.  Denies any cough, chest congestion, nasal congestion or rhinorrhea, fevers.  Patient has history of cholecystectomy     Past Medical History:  Diagnosis Date  . Diabetes mellitus without complication (HCC)   . Hypertension   . Sleep apnea     There are no problems to display for this patient.   Past Surgical History:  Procedure Laterality Date  . HAND SURGERY    . HEMORRHOID SURGERY         Home Medications    Prior to Admission medications   Medication Sig Start Date End Date Taking? Authorizing Provider  insulin regular (NOVOLIN R) 100 units/mL injection Inject 16 Units into the skin 3 (three) times daily before meals.    [provider]  losartan (COZAAR) 50 MG tablet Take 25 mg by mouth daily.    [provider]    Family History Family History  Problem Relation Age of Onset  . Hypertension Mother   . Diabetes Mother     Social History Social History   Tobacco Use  . Smoking status: Current Every Day Smoker    Packs/day: 0.25    Types: Cigarettes  . Smokeless tobacco: Never Used  Substance Use Topics  . Alcohol use: Yes    Comment: occ  . Drug use: No     Allergies   Patient has no known allergies.   Review of Systems Review of Systems   Physical Exam Triage Vital Signs ED Triage Vitals  Enc Vitals Group     BP 07/12/20  1240 137/86     Pulse Rate 07/12/20 1240 (!) 101     Resp 07/12/20 1240 (!) 21     Temp 07/12/20 1240 98.6 F (37 C)     Temp Source 07/12/20 1240 Oral     SpO2 07/12/20 1240 98 %     Weight --      Height --      Head Circumference --      Peak Flow --      Pain Score 07/12/20 1235 1     Pain Loc --      Pain Edu? --      Excl. in GC? --    No data found.  Updated Vital Signs BP 137/86 (BP Location: Right Arm)   Pulse (!) 101   Temp 98.6 F (37 C) (Oral)   Resp (!) 21   SpO2 98%   Visual Acuity Right Eye Distance:   Left Eye Distance:   Bilateral Distance:    Right Eye Near:   Left Eye Near:    Bilateral Near:     Physical Exam Constitutional:      General: He is not in acute distress.    Appearance: He is not ill-appearing, toxic-appearing or diaphoretic.  HENT:  Head: Normocephalic and atraumatic.     Nose: Nose normal.     Mouth/Throat:     Pharynx: Oropharynx is clear.  Eyes:     Conjunctiva/sclera: Conjunctivae normal.  Cardiovascular:     Rate and Rhythm: Normal rate and regular rhythm.  Pulmonary:     Effort: Pulmonary effort is normal.     Breath sounds: Normal breath sounds.  Chest:     Comments: Chest pain reproducible with palpation and movement.  Abdominal:     Palpations: Abdomen is soft.     Tenderness: There is abdominal tenderness.     Comments: Generalized abdominal tenderness throughout entire abdomen  Musculoskeletal:        General: Normal range of motion.     Cervical back: Normal range of motion.  Skin:    General: Skin is warm and dry.  Neurological:     Mental Status: He is alert.      UC Treatments / Results  Labs (all labs ordered are listed, but only abnormal results are displayed) Labs Reviewed  SARS CORONAVIRUS 2 (TAT 6-24 HRS)    EKG   Radiology No results found.  Procedures ED EKG  Date/Time: 07/13/2020 8:30 AM Performed by: Janace Aris, NP Authorized by: Eustace Moore, MD   Previous ECG:     Previous ECG:  Unavailable Interpretation:    Interpretation: non-specific   Rate:    ECG rate assessment: normal   Rhythm:    Rhythm: sinus rhythm   Ectopy:    Ectopy: none   QRS:    QRS axis:  Normal Conduction:    Conduction: normal   ST segments:    ST segments:  Non-specific   Depression:  AVL T waves:    T waves: non-specific     (including critical care time)  Medications Ordered in UC Medications - No data to display  Initial Impression / Assessment and Plan / UC Course  I have reviewed the triage vital signs and the nursing notes.  Pertinent labs & imaging results that were available during my care of the patient were reviewed by me and considered in my medical decision making (see chart for details).     Noninfectious gastroenteritis Most of the cause of symptoms. EKG today not concerning.  Recommended Gatorade and water to rehydrate and replace electrolytes.  Bland diet for now. Recommend ER for any worsening problems. Covid swab pending Final Clinical Impressions(s) / UC Diagnoses   Final diagnoses:  Noninfectious gastroenteritis, unspecified type     Discharge Instructions     Patient you are rehydrating with Gatorade for electrolytes, low sugar and water. Bland diet for now. For any continued or worsening problems you will need to go to the ER.  Covid swabs to be back by tomorrow    ED Prescriptions    None     PDMP not reviewed this encounter.   Janace Aris, NP 07/13/20 [phone]

## 2020-10-11 ENCOUNTER — Emergency Department (HOSPITAL_COMMUNITY)
Admission: EM | Admit: 2020-10-11 | Discharge: 2020-10-11 | Discharge status: Against Medical Advice | Payer: Medicaid Other

## 2020-10-11 ENCOUNTER — Other Ambulatory Visit: Payer: Self-pay

## 2020-12-07 ENCOUNTER — Emergency Department (HOSPITAL_COMMUNITY)
Admission: EM | Admit: 2020-12-07 | Discharge: 2020-12-08 | Disposition: A | Discharge status: Home / Self Care | Payer: Medicaid Other | Attending: Emergency Medicine | Admitting: Emergency Medicine

## 2020-12-07 ENCOUNTER — Encounter (HOSPITAL_COMMUNITY): Payer: Self-pay | Admitting: *Deleted

## 2020-12-07 ENCOUNTER — Other Ambulatory Visit: Payer: Self-pay

## 2020-12-07 DIAGNOSIS — I10 Essential (primary) hypertension: Secondary | ICD-10-CM | POA: Insufficient documentation

## 2020-12-07 DIAGNOSIS — S161XXA Strain of muscle, fascia and tendon at neck level, initial encounter: Secondary | ICD-10-CM | POA: Insufficient documentation

## 2020-12-07 DIAGNOSIS — Y92410 Unspecified street and highway as the place of occurrence of the external cause: Secondary | ICD-10-CM | POA: Insufficient documentation

## 2020-12-07 DIAGNOSIS — Z794 Long term (current) use of insulin: Secondary | ICD-10-CM | POA: Diagnosis not present

## 2020-12-07 DIAGNOSIS — F17210 Nicotine dependence, cigarettes, uncomplicated: Secondary | ICD-10-CM | POA: Insufficient documentation

## 2020-12-07 DIAGNOSIS — E119 Type 2 diabetes mellitus without complications: Secondary | ICD-10-CM | POA: Insufficient documentation

## 2020-12-07 DIAGNOSIS — S39012A Strain of muscle, fascia and tendon of lower back, initial encounter: Secondary | ICD-10-CM | POA: Diagnosis not present

## 2020-12-07 DIAGNOSIS — S3992XA Unspecified injury of lower back, initial encounter: Secondary | ICD-10-CM | POA: Diagnosis present

## 2020-12-07 MED ORDER — HYDROCODONE-ACETAMINOPHEN 5-325 MG PO TABS
2.0000 | ORAL_TABLET | Freq: Once | ORAL | Status: AC
  Administered 2020-12-07: 2 via ORAL
  Filled 2020-12-07: qty 2

## 2020-12-07 MED ORDER — KETOROLAC TROMETHAMINE 30 MG/ML IJ SOLN
30.0000 mg | Freq: Once | INTRAMUSCULAR | Status: AC
  Administered 2020-12-07: 30 mg via INTRAMUSCULAR
  Filled 2020-12-07: qty 1
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
## 2020-12-08 ENCOUNTER — Emergency Department (HOSPITAL_COMMUNITY): Payer: Medicaid Other

## 2020-12-08 MED ORDER — CYCLOBENZAPRINE HCL 10 MG PO TABS: 10.0000 mg | ORAL_TABLET | Freq: Three times a day (TID) | ORAL | 0 refills | Status: DC | PRN

## 2020-12-08 MED ORDER — IBUPROFEN 800 MG PO TABS: 800.0000 mg | ORAL_TABLET | Freq: Three times a day (TID) | ORAL | 0 refills | Status: DC
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
## 2020-12-12 ENCOUNTER — Encounter (HOSPITAL_COMMUNITY): Payer: Self-pay

## 2020-12-12 ENCOUNTER — Other Ambulatory Visit: Payer: Self-pay

## 2020-12-12 ENCOUNTER — Emergency Department (HOSPITAL_COMMUNITY)
Admission: EM | Admit: 2020-12-12 | Discharge: 2020-12-12 | Disposition: A | Discharge status: Home / Self Care | Payer: Medicaid Other | Attending: Emergency Medicine | Admitting: Emergency Medicine

## 2020-12-12 DIAGNOSIS — M542 Cervicalgia: Secondary | ICD-10-CM | POA: Diagnosis not present

## 2020-12-12 DIAGNOSIS — M79605 Pain in left leg: Secondary | ICD-10-CM | POA: Insufficient documentation

## 2020-12-12 DIAGNOSIS — I10 Essential (primary) hypertension: Secondary | ICD-10-CM | POA: Diagnosis not present

## 2020-12-12 DIAGNOSIS — M25511 Pain in right shoulder: Secondary | ICD-10-CM | POA: Diagnosis not present

## 2020-12-12 DIAGNOSIS — M545 Low back pain, unspecified: Secondary | ICD-10-CM | POA: Insufficient documentation

## 2020-12-12 DIAGNOSIS — R519 Headache, unspecified: Secondary | ICD-10-CM | POA: Diagnosis not present

## 2020-12-12 DIAGNOSIS — E119 Type 2 diabetes mellitus without complications: Secondary | ICD-10-CM | POA: Diagnosis not present

## 2020-12-12 DIAGNOSIS — M25512 Pain in left shoulder: Secondary | ICD-10-CM | POA: Insufficient documentation

## 2020-12-12 DIAGNOSIS — F17210 Nicotine dependence, cigarettes, uncomplicated: Secondary | ICD-10-CM | POA: Insufficient documentation

## 2020-12-12 DIAGNOSIS — Z79899 Other long term (current) drug therapy: Secondary | ICD-10-CM | POA: Insufficient documentation

## 2020-12-12 DIAGNOSIS — Z794 Long term (current) use of insulin: Secondary | ICD-10-CM | POA: Diagnosis not present

## 2020-12-12 HISTORY — DX: Depression, unspecified: F32.A

## 2020-12-12 MED ORDER — LIDOCAINE 5 % EX PTCH: 1.0000 | MEDICATED_PATCH | CUTANEOUS | 0 refills | Status: AC

## 2020-12-12 MED ORDER — LIDOCAINE 5 % EX PTCH
1.0000 | MEDICATED_PATCH | CUTANEOUS | Status: DC
  Administered 2020-12-12: 1 via TRANSDERMAL
  Filled 2020-12-12: qty 1

## 2020-12-12 MED ORDER — METHOCARBAMOL 500 MG PO TABS: 500.0000 mg | ORAL_TABLET | Freq: Two times a day (BID) | ORAL | 0 refills | Status: DC

## 2020-12-12 MED ORDER — KETOROLAC TROMETHAMINE 60 MG/2ML IM SOLN
60.0000 mg | Freq: Once | INTRAMUSCULAR | Status: AC
  Administered 2020-12-12: 60 mg via INTRAMUSCULAR
  Filled 2020-12-12: qty 2

## 2020-12-12 NOTE — Discharge Instructions (Addendum)
You came to the emergency department to be evaluated for your neck pain and low back pain.  Physical exam was reassuring.  Your symptoms are likely musculoskeletal in nature and should improve with time.  I have given you prescription for Robaxin.  Please use this instead of the Flexeril as that was not working for you.  Also apply lidocaine patches as needed to help with your pain.  Please follow-up with your primary care provider.  Have given you information to follow-up with an orthopedic specialist if your symptoms do not improve.
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
## 2021-02-15 ENCOUNTER — Encounter (HOSPITAL_COMMUNITY): Payer: Self-pay

## 2021-02-15 ENCOUNTER — Other Ambulatory Visit: Payer: Self-pay

## 2021-02-15 ENCOUNTER — Ambulatory Visit (HOSPITAL_COMMUNITY)
Admission: EM | Admit: 2021-02-15 | Discharge: 2021-02-15 | Disposition: A | Discharge status: Home / Self Care | Payer: Medicaid Other | Attending: Emergency Medicine | Admitting: Emergency Medicine

## 2021-02-15 DIAGNOSIS — Z79899 Other long term (current) drug therapy: Secondary | ICD-10-CM | POA: Diagnosis not present

## 2021-02-15 DIAGNOSIS — F17210 Nicotine dependence, cigarettes, uncomplicated: Secondary | ICD-10-CM | POA: Insufficient documentation

## 2021-02-15 DIAGNOSIS — R051 Acute cough: Secondary | ICD-10-CM | POA: Diagnosis not present

## 2021-02-15 DIAGNOSIS — B349 Viral infection, unspecified: Secondary | ICD-10-CM | POA: Diagnosis not present

## 2021-02-15 DIAGNOSIS — Z20822 Contact with and (suspected) exposure to covid-19: Secondary | ICD-10-CM | POA: Diagnosis not present

## 2021-02-15 DIAGNOSIS — Z794 Long term (current) use of insulin: Secondary | ICD-10-CM | POA: Insufficient documentation

## 2021-02-15 DIAGNOSIS — Z791 Long term (current) use of non-steroidal anti-inflammatories (NSAID): Secondary | ICD-10-CM | POA: Diagnosis not present

## 2021-02-15 DIAGNOSIS — Z7984 Long term (current) use of oral hypoglycemic drugs: Secondary | ICD-10-CM | POA: Insufficient documentation

## 2021-02-15 DIAGNOSIS — R0981 Nasal congestion: Secondary | ICD-10-CM | POA: Diagnosis present

## 2021-02-15 LAB — SARS CORONAVIRUS 2 (TAT 6-24 HRS): SARS Coronavirus 2: NEGATIVE

## 2021-02-15 NOTE — ED Provider Notes (Signed)
[facility]    CSN: [id number] Arrival date & time: 02/15/21  0803      History   Chief Complaint Chief Complaint  Patient presents with  . Nasal Congestion  . Generalized Body Aches    HPI Jesse Whitehead is a 60 y.o. male.   Patient here for evaluation of nasal congestion and cough that have been ongoing for the past few days.  Reports family member recently tested positive for COVID.  Patient reports being fully vaccinated and receiving COVID booster.  Has not taken any OTC medications or treatments.  Denies any trauma, injury, or other precipitating event.  Denies any specific alleviating or aggravating factors.  Denies any fevers, chest pain, shortness of breath, N/V/D, numbness, tingling, weakness, abdominal pain, or headaches.    The history is provided by the patient.    Past Medical History:  Diagnosis Date  . Depression   . Diabetes mellitus without complication (HCC)   . Hypertension   . Sleep apnea     There are no problems to display for this patient.   Past Surgical History:  Procedure Laterality Date  . HAND SURGERY    . HEMORRHOID SURGERY         Home Medications    Prior to Admission medications   Medication Sig Start Date End Date Taking? Authorizing Provider  atorvastatin (LIPITOR) 40 MG tablet TAKE ONE TABLET BY MOUTH AT BEDTIME FOR CHOLESTEROL 06/17/20 06/18/21 Yes [provider]  chlorthalidone (HYGROTON) 25 MG tablet Take 0.5 tablets by mouth daily. 10/18/20 06/18/21 Yes [provider]  DULoxetine (CYMBALTA) 30 MG capsule Take by mouth. 05/06/20  Yes [provider]  gabapentin (NEURONTIN) 300 MG capsule TAKE ONE CAPSULE BY MOUTH EVERY MORNING AND AT NOON AND TAKE TWO CAPSULES AT BEDTIME 06/17/20 06/18/21 Yes [provider]  insulin glargine (LANTUS) 100 UNIT/ML injection INJECT 32 UNITS SUBCUTANEOUSLY AT BEDTIME FOR DIABETES (USE WITHIN 28 DAYS AFTER OPENING VIAL) 10/18/20 10/19/21 Yes [provider]  pantoprazole (PROTONIX) 40 MG tablet Take by mouth. 12/27/20  Yes [provider]  Semaglutide,0.25 or 0.5MG /DOS, 2 MG/1.5ML SOPN Inject into the skin. 11/15/20  Yes [provider]  cyclobenzaprine (FLEXERIL) 10 MG tablet Take 1 tablet (10 mg total) by mouth 3 (three) times daily as needed for muscle spasms. 12/08/20   Roxy Horseman, PA-C  ibuprofen (ADVIL) 800 MG tablet Take 1 tablet (800 mg total) by mouth 3 (three) times daily. 12/08/20   Roxy Horseman, PA-C  insulin regular (NOVOLIN R) 100 units/mL injection Inject 16 Units into the skin 3 (three) times daily before meals.    [provider]  lidocaine (LIDODERM) 5 % Place 1 patch onto the skin daily. Remove & Discard patch within 12 hours or as directed by MD 12/12/20   Haskel Schroeder, PA-C  losartan (COZAAR) 50 MG tablet Take 25 mg by mouth daily.    [provider]  methocarbamol (ROBAXIN) 500 MG tablet Take 1 tablet (500 mg total) by mouth 2 (two) times daily. 12/12/20   Haskel Schroeder, PA-C    Family History Family History  Problem Relation Age of Onset  . Hypertension Mother   . Diabetes Mother     Social History Social History   Tobacco Use  . Smoking status: Current Every Day Smoker    Packs/day: 0.25    Types: Cigarettes  . Smokeless tobacco: Never Used  Vaping Use  . Vaping Use: Never used  Substance Use Topics  .  Alcohol use: Yes    Comment: occ  . Drug use: No     Allergies   Patient has no known allergies.   Review of Systems Review of Systems  HENT: Positive for congestion.   Respiratory: Positive for cough.   All other systems reviewed and are negative.    Physical Exam Triage Vital Signs ED Triage Vitals  Enc Vitals Group     BP 02/15/21 0814 (!) 149/96     Pulse Rate 02/15/21 0814 (!) 104     Resp 02/15/21 0814 19     Temp 02/15/21 0814 99.4 F (37.4 C)     Temp Source 02/15/21 0814 Oral     SpO2 02/15/21 0814 99 %     Weight  --      Height --      Head Circumference --      Peak Flow --      Pain Score 02/15/21 0811 4     Pain Loc --      Pain Edu? --      Excl. in GC? --    No data found.  Updated Vital Signs BP (!) 149/96 (BP Location: Left Arm)   Pulse (!) 104   Temp 99.4 F (37.4 C) (Oral)   Resp 19   SpO2 99%   Visual Acuity Right Eye Distance:   Left Eye Distance:   Bilateral Distance:    Right Eye Near:   Left Eye Near:    Bilateral Near:     Physical Exam Vitals and nursing note reviewed.  Constitutional:      General: He is not in acute distress.    Appearance: Normal appearance. He is not ill-appearing, toxic-appearing or diaphoretic.  HENT:     Head: Normocephalic and atraumatic.     Nose: Congestion present.  Eyes:     Conjunctiva/sclera: Conjunctivae normal.  Cardiovascular:     Rate and Rhythm: Normal rate.     Pulses: Normal pulses.     Heart sounds: Normal heart sounds.  Pulmonary:     Effort: Pulmonary effort is normal.     Breath sounds: Normal breath sounds.  Abdominal:     General: Abdomen is flat.  Musculoskeletal:        General: Normal range of motion.     Cervical back: Normal range of motion.  Skin:    General: Skin is warm and dry.  Neurological:     General: No focal deficit present.     Mental Status: He is alert and oriented to person, place, and time.  Psychiatric:        Mood and Affect: Mood normal.      UC Treatments / Results  Labs (all labs ordered are listed, but only abnormal results are displayed) Labs Reviewed  SARS CORONAVIRUS 2 (TAT 6-24 HRS)    EKG   Radiology No results found.  Procedures Procedures (including critical care time)  Medications Ordered in UC Medications - No data to display  Initial Impression / Assessment and Plan / UC Course  I have reviewed the triage vital signs and the nursing notes.  Pertinent labs & imaging results that were available during my care of the patient were reviewed by me and  considered in my medical decision making (see chart for details).    Assessment negative for red flags or concerns. Likely Viral illness.  COVID test pending.  Discussed quarantining while waiting for results.  Discussed conservative symptom management as described in discharge  instructions.  Follow up with primary care as needed.  Final Clinical Impressions(s) / UC Diagnoses   Final diagnoses:  Viral illness     Discharge Instructions     You most likely have viral illness.   You can take Tylenol and/or Ibuprofen as needed for fever reduction and pain relief.    For cough: honey 1/2 to 1 teaspoon (you can dilute the honey in water or another fluid).  You can also use guaifenesin and dextromethorphan for cough. You can use a humidifier for chest congestion and cough.  If you don't have a humidifier, you can sit in the bathroom with the hot shower running.     For sore throat: try warm salt water gargles, cepacol lozenges, throat spray, warm tea or water with lemon/honey, popsicles or ice, or OTC cold relief medicine for throat discomfort.    For congestion: take a daily anti-histamine like Zyrtec, Claritin, and a oral decongestant, such as pseudoephedrine.  You can also use Flonase 1-2 sprays in each nostril daily.    It is important to stay hydrated: drink plenty of fluids (water, gatorade/powerade/pedialyte, juices, or teas) to keep your throat moisturized and help further relieve irritation/discomfort.   Return or go to the Emergency Department if symptoms worsen or do not improve in the next few days.     ED Prescriptions    None     PDMP not reviewed this encounter.   Ivette Loyal, NP 02/15/21 [phone]

## 2021-02-15 NOTE — Discharge Instructions (Addendum)
You most likely have viral illness.   You can take Tylenol and/or Ibuprofen as needed for fever reduction and pain relief.    For cough: honey 1/2 to 1 teaspoon (you can dilute the honey in water or another fluid).  You can also use guaifenesin and dextromethorphan for cough. You can use a humidifier for chest congestion and cough.  If you don't have a humidifier, you can sit in the bathroom with the hot shower running.     For sore throat: try warm salt water gargles, cepacol lozenges, throat spray, warm tea or water with lemon/honey, popsicles or ice, or OTC cold relief medicine for throat discomfort.    For congestion: take a daily anti-histamine like Zyrtec, Claritin, and a oral decongestant, such as pseudoephedrine.  You can also use Flonase 1-2 sprays in each nostril daily.    It is important to stay hydrated: drink plenty of fluids (water, gatorade/powerade/pedialyte, juices, or teas) to keep your throat moisturized and help further relieve irritation/discomfort.   Return or go to the Emergency Department if symptoms worsen or do not improve in the next few days.  

## 2021-02-15 NOTE — ED Triage Notes (Signed)
Pt in with c/o runny nose and body aches x 2 days  States a family member recently tested positive for covid

## 2021-05-15 ENCOUNTER — Encounter (HOSPITAL_COMMUNITY): Payer: Self-pay | Admitting: Emergency Medicine

## 2021-05-15 ENCOUNTER — Other Ambulatory Visit: Payer: Self-pay

## 2021-05-15 ENCOUNTER — Ambulatory Visit (INDEPENDENT_AMBULATORY_CARE_PROVIDER_SITE_OTHER): Payer: Medicaid Other

## 2021-05-15 ENCOUNTER — Ambulatory Visit (HOSPITAL_COMMUNITY)
Admission: EM | Admit: 2021-05-15 | Discharge: 2021-05-15 | Disposition: A | Discharge status: Home / Self Care | Payer: Medicaid Other | Attending: Emergency Medicine | Admitting: Emergency Medicine

## 2021-05-15 DIAGNOSIS — M25571 Pain in right ankle and joints of right foot: Secondary | ICD-10-CM | POA: Diagnosis not present

## 2021-05-15 DIAGNOSIS — M25572 Pain in left ankle and joints of left foot: Secondary | ICD-10-CM | POA: Diagnosis not present

## 2021-05-15 DIAGNOSIS — R2241 Localized swelling, mass and lump, right lower limb: Secondary | ICD-10-CM | POA: Diagnosis not present

## 2021-05-15 MED ORDER — NAPROXEN 500 MG PO TABS: 500.0000 mg | ORAL_TABLET | Freq: Two times a day (BID) | ORAL | 0 refills | Status: DC

## 2021-05-15 NOTE — ED Triage Notes (Signed)
Pt presents with left leg pain and swelling xs 1 week. States roll ankle on curb approx 1 week ago. States has bruising around ankle.

## 2021-05-15 NOTE — Discharge Instructions (Addendum)
Your x-ray today did not show any injury to the bones, tendons, ligaments  Take naproxen twice a day for the next week to help with swelling  If there is no improvement seen but symptoms do not worsen please follow-up with your primary care doctor for reevaluation  At any point if swelling increases, skin begins to feel tight, skin begins to feel hot, he began to have trouble breathing or pain with breathing please go to the nearest emergency department for evaluation
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
## 2021-08-01 ENCOUNTER — Encounter (HOSPITAL_COMMUNITY): Payer: Self-pay | Admitting: Emergency Medicine

## 2021-08-01 ENCOUNTER — Other Ambulatory Visit: Payer: Self-pay

## 2021-08-01 ENCOUNTER — Ambulatory Visit (HOSPITAL_COMMUNITY)
Admission: EM | Admit: 2021-08-01 | Discharge: 2021-08-01 | Disposition: A | Discharge status: Home / Self Care | Payer: Medicaid Other

## 2021-08-01 DIAGNOSIS — J069 Acute upper respiratory infection, unspecified: Secondary | ICD-10-CM

## 2021-08-01 DIAGNOSIS — H66001 Acute suppurative otitis media without spontaneous rupture of ear drum, right ear: Secondary | ICD-10-CM | POA: Diagnosis not present

## 2021-08-01 MED ORDER — PREDNISONE 20 MG PO TABS: 20.0000 mg | ORAL_TABLET | Freq: Every day | ORAL | 0 refills | Status: AC

## 2021-08-01 MED ORDER — AMOXICILLIN-POT CLAVULANATE 875-125 MG PO TABS: 1.0000 | ORAL_TABLET | Freq: Two times a day (BID) | ORAL | 0 refills | Status: DC

## 2021-08-01 MED ORDER — TRIAMCINOLONE ACETONIDE 55 MCG/ACT NA AERO: 2.0000 | INHALATION_SPRAY | Freq: Every day | NASAL | 12 refills | Status: DC

## 2021-08-01 MED ORDER — PREDNISONE 20 MG PO TABS: 20.0000 mg | ORAL_TABLET | Freq: Every day | ORAL | 0 refills | Status: DC

## 2021-08-01 MED ORDER — TRIAMCINOLONE ACETONIDE 55 MCG/ACT NA AERO: 2.0000 | INHALATION_SPRAY | Freq: Every day | NASAL | 12 refills | Status: AC

## 2021-08-01 NOTE — ED Provider Notes (Signed)
[place]    CSN: [id number] Arrival date & time: 08/01/21  0802      History   Chief Complaint Chief Complaint  Patient presents with   Otalgia    HPI Jesse Whitehead is a 60 y.o. male presenting with R ear pain.  Medical history diabetes.  Patient states that he has been sick multiple times recently, initially with a virus 3 weeks ago with cough and congestion.  This seemed to resolve, but then he got food poisoning last weekend.  States that he had a lot of vomiting and diarrhea, though this has almost entirely resolved.  He is now tolerating fluids and food without issue.  States that his ear has been getting progressively more painful over the last 3 weeks, he now has muffled hearing.  Denies discharge from the ear, dizziness, tinnitus, fever/chills, cough, congestion.  HPI  Past Medical History:  Diagnosis Date   Depression    Diabetes mellitus without complication ([place])    Hypertension    Sleep apnea     There are no problems to display for this patient.   Past Surgical History:  Procedure Laterality Date   HAND SURGERY     HEMORRHOID SURGERY         Home Medications    Prior to Admission medications   Medication Sig Start Date End Date Taking? Authorizing Provider  gabapentin (NEURONTIN) 300 MG capsule Take 300 mg by mouth 3 (three) times daily.   Yes [provider]  amoxicillin-clavulanate (AUGMENTIN) 875-125 MG tablet Take 1 tablet by mouth every 12 (twelve) hours. 08/01/21   Hazel Sams, PA-C  atorvastatin (LIPITOR) 40 MG tablet TAKE ONE TABLET BY MOUTH AT BEDTIME FOR CHOLESTEROL 06/17/20 06/18/21  [provider]  chlorthalidone (HYGROTON) 25 MG tablet Take 0.5 tablets by mouth daily. 10/18/20 06/18/21  [provider]  cyclobenzaprine (FLEXERIL) 10 MG tablet Take 1 tablet (10 mg total) by mouth 3 (three) times daily as needed for muscle spasms. Patient not taking: Reported on 08/01/2021 12/08/20   Montine Circle, PA-C  DULoxetine (CYMBALTA) 30 MG capsule Take by mouth. 05/06/20   [provider]  gabapentin (NEURONTIN) 300 MG capsule TAKE ONE CAPSULE BY MOUTH EVERY MORNING AND AT NOON AND TAKE TWO CAPSULES AT BEDTIME 06/17/20 06/18/21  [provider]  ibuprofen (ADVIL) 800 MG tablet Take 1 tablet (800 mg total) by mouth 3 (three) times daily. 12/08/20   Montine Circle, PA-C  insulin glargine (LANTUS) 100 UNIT/ML injection INJECT 32 UNITS SUBCUTANEOUSLY AT BEDTIME FOR DIABETES (USE WITHIN 28 DAYS AFTER OPENING VIAL) 10/18/20 10/19/21  [provider]  insulin regular (NOVOLIN R) 100 units/mL injection Inject 16 Units into the skin 3 (three) times daily before meals.    [provider]  lidocaine (LIDODERM) 5 % Place 1 patch onto the skin daily. Remove & Discard patch within 12 hours or as directed by MD 12/12/20   Loni Beckwith, PA-C  losartan (COZAAR) 50 MG tablet Take 25 mg by mouth daily.    [provider]  methocarbamol (ROBAXIN) 500 MG tablet Take 1 tablet (500 mg total) by mouth 2 (two) times daily. Patient not taking: Reported on 08/01/2021 12/12/20   Loni Beckwith, PA-C  naproxen (NAPROSYN) 500 MG tablet Take 1 tablet (500 mg total) by mouth 2 (two) times daily. Patient not taking: Reported on 08/01/2021 05/15/21   Hans Eden, NP  pantoprazole (PROTONIX) 40 MG tablet Take by mouth. 12/27/20   [provider]  predniSONE (DELTASONE) 20 MG tablet Take 1 tablet (20 mg total) by mouth daily for 5 days. 08/01/21 08/06/21  Hazel Sams, PA-C  Semaglutide,0.25 or 0.5MG /DOS, 2 MG/1.5ML SOPN Inject into the skin. 11/15/20   [provider]  triamcinolone (NASACORT) 55 MCG/ACT AERO nasal inhaler Place 2 sprays into the nose daily. 08/01/21   Hazel Sams, PA-C    Family History Family History  Problem Relation Age of Onset   Hypertension Mother    Diabetes Mother     Social History Social History   Tobacco Use    Smoking status: Every Day    Packs/day: 0.25    Types: Cigarettes   Smokeless tobacco: Never  Vaping Use   Vaping Use: Never used  Substance Use Topics   Alcohol use: Yes    Comment: occ   Drug use: Yes    Types: Marijuana     Allergies   Patient has no known allergies.   Review of Systems Review of Systems  Constitutional:  Negative for appetite change, chills and fever.  HENT:  Positive for ear pain. Negative for congestion, rhinorrhea, sinus pressure, sinus pain and sore throat.   Eyes:  Negative for redness and visual disturbance.  Respiratory:  Negative for cough, chest tightness, shortness of breath and wheezing.   Cardiovascular:  Negative for chest pain and palpitations.  Gastrointestinal:  Negative for abdominal pain, constipation, diarrhea, nausea and vomiting.  Genitourinary:  Negative for dysuria, frequency and urgency.  Musculoskeletal:  Negative for myalgias.  Neurological:  Negative for dizziness, weakness and headaches.  Psychiatric/Behavioral:  Negative for confusion.   All other systems reviewed and are negative.   Physical Exam Triage Vital Signs ED Triage Vitals  Enc Vitals Group     BP 08/01/21 0831 124/88     Pulse Rate 08/01/21 0831 (!) 110     Resp 08/01/21 0831 (!) 22     Temp 08/01/21 0831 98.3 F (36.8 C)     Temp Source 08/01/21 0831 Oral     SpO2 08/01/21 0831 96 %     Weight --      Height --      Head Circumference --      Peak Flow --      Pain Score 08/01/21 0827 0     Pain Loc --      Pain Edu? --      Excl. in [place]? --    No data found.  Updated Vital Signs BP 124/88 (BP Location: Left Arm) Comment (BP Location): large cuff  Pulse (!) 110   Temp 98.3 F (36.8 C) (Oral)   Resp (!) 22   SpO2 96%   Visual Acuity Right Eye Distance:   Left Eye Distance:   Bilateral Distance:    Right Eye Near:   Left Eye Near:    Bilateral Near:     Physical Exam Vitals reviewed.  Constitutional:      Appearance: Normal  appearance. He is not ill-appearing.  HENT:     Head: Normocephalic and atraumatic.     Right Ear: Hearing, ear canal and external ear normal. Tenderness present. No swelling. No middle ear effusion. There is no impacted cerumen. No mastoid tenderness. Tympanic membrane is injected and bulging. Tympanic membrane is not scarred, perforated, erythematous or retracted.     Left Ear: Hearing, tympanic membrane, ear canal and external ear normal. No swelling or tenderness.  No middle ear effusion. There is no impacted cerumen.  No mastoid tenderness. Tympanic membrane is not injected, scarred, perforated, erythematous, retracted or bulging.     Mouth/Throat:     Pharynx: Oropharynx is clear. No oropharyngeal exudate or posterior oropharyngeal erythema.  Cardiovascular:     Rate and Rhythm: Normal rate and regular rhythm.     Heart sounds: Normal heart sounds.  Pulmonary:     Effort: Pulmonary effort is normal.     Breath sounds: Normal breath sounds.  Lymphadenopathy:     Cervical: No cervical adenopathy.  Neurological:     General: No focal deficit present.     Mental Status: He is alert and oriented to person, place, and time.  Psychiatric:        Mood and Affect: Mood normal.        Behavior: Behavior normal.        Thought Content: Thought content normal.        Judgment: Judgment normal.     UC Treatments / Results  Labs (all labs ordered are listed, but only abnormal results are displayed) Labs Reviewed - No data to display  EKG   Radiology No results found.  Procedures Procedures (including critical care time)  Medications Ordered in UC Medications - No data to display  Initial Impression / Assessment and Plan / UC Course  I have reviewed the triage vital signs and the nursing notes.  Pertinent labs & imaging results that were available during my care of the patient were reviewed by me and considered in my medical decision making (see chart for details).     This  patient is a very pleasant 60 y.o. year old male presenting with R AOM following viral URI. Today this pt is afebrile nontachycardic nontachypneic, oxygenating well on room air, no wheezes rhonchi or rales. Tachy at 110 and borderline tachypneic.   Will manage with augmentin, prednisone, nasacort.   ED return precautions discussed. Patient verbalizes understanding and agreement.   Coding Level 4 for acute illness with systemic symptoms, and prescription drug management  Final Clinical Impressions(s) / UC Diagnoses   Final diagnoses:  Non-recurrent acute suppurative otitis media of right ear without spontaneous rupture of tympanic membrane  Recent URI     Discharge Instructions      -Start the antibiotic-Augmentin (amoxicillin-clavulanate), 1 pill every 12 hours for 7 days.  You can take this with food like with breakfast and dinner. -Nasacort 1-2x daily -Prednisone one pill with breakfast x5 days. Take with food.  -You can take Tylenol up to 1000 mg 3 times daily for pain -Follow-up if symptoms worsen/persist      ED Prescriptions     Medication Sig Dispense Auth. Provider   predniSONE (DELTASONE) 20 MG tablet  (Status: Discontinued) Take 1 tablet (20 mg total) by mouth daily for 5 days. 5 tablet Rhys Martini, PA-C   amoxicillin-clavulanate (AUGMENTIN) 875-125 MG tablet  (Status: Discontinued) Take 1 tablet by mouth every 12 (twelve) hours. 14 tablet Rhys Martini, PA-C   triamcinolone (NASACORT) 55 MCG/ACT AERO nasal inhaler  (Status: Discontinued) Place 2 sprays into the nose daily. 1 each Rhys Martini, PA-C   amoxicillin-clavulanate (AUGMENTIN) 875-125 MG tablet Take 1 tablet by mouth every 12 (twelve) hours. 14 tablet Rhys Martini, PA-C   predniSONE (DELTASONE) 20 MG tablet Take 1 tablet (20 mg total) by mouth daily for 5 days. 5 tablet Rhys Martini, PA-C   triamcinolone (NASACORT) 55 MCG/ACT AERO nasal inhaler Place 2 sprays into the nose daily. 1 each  Hazel Sams, PA-C      PDMP not reviewed this encounter.   Hazel Sams, PA-C 08/01/21 [id number]

## 2021-08-01 NOTE — ED Triage Notes (Signed)
3 weeks ago had an illness.  Patient recovered.  Sunday had a vomiting illness.  Reports feeling better since yesterday.  Patient reports right ear is stuffy, will not open up.  Patient has tried sweet oil, peroxide.  No improvement.  "Its not opening up"

## 2021-08-01 NOTE — Discharge Instructions (Addendum)
-  Start the antibiotic-Augmentin (amoxicillin-clavulanate), 1 pill every 12 hours for 7 days.  You can take this with food like with breakfast and dinner. -Nasacort 1-2x daily -Prednisone one pill with breakfast x5 days. Take with food.  -You can take Tylenol up to 1000 mg 3 times daily for pain -Follow-up if symptoms worsen/persist

## 2021-10-09 ENCOUNTER — Encounter: Payer: Medicaid Other | Admitting: Student

## 2022-03-10 ENCOUNTER — Ambulatory Visit (HOSPITAL_COMMUNITY)
Admission: EM | Admit: 2022-03-10 | Discharge: 2022-03-10 | Disposition: A | Discharge status: Home / Self Care | Payer: Medicaid Other | Attending: Physician Assistant | Admitting: Physician Assistant

## 2022-03-10 ENCOUNTER — Encounter (HOSPITAL_COMMUNITY): Payer: Self-pay

## 2022-03-10 DIAGNOSIS — L03032 Cellulitis of left toe: Secondary | ICD-10-CM | POA: Diagnosis not present

## 2022-03-10 DIAGNOSIS — M79675 Pain in left toe(s): Secondary | ICD-10-CM | POA: Diagnosis not present

## 2022-03-10 MED ORDER — SULFAMETHOXAZOLE-TRIMETHOPRIM 800-160 MG PO TABS: 1.0000 | ORAL_TABLET | Freq: Two times a day (BID) | ORAL | 0 refills | Status: AC

## 2022-03-10 NOTE — ED Triage Notes (Signed)
Pt reports ingrown toe nail on his left foot great toe.

## 2022-04-04 ENCOUNTER — Encounter (HOSPITAL_COMMUNITY): Payer: Self-pay | Admitting: Emergency Medicine

## 2022-04-04 ENCOUNTER — Ambulatory Visit (HOSPITAL_COMMUNITY)
Admission: EM | Admit: 2022-04-04 | Discharge: 2022-04-04 | Disposition: A | Discharge status: Home / Self Care | Payer: Medicaid Other | Attending: Physician Assistant | Admitting: Physician Assistant

## 2022-04-04 ENCOUNTER — Ambulatory Visit (INDEPENDENT_AMBULATORY_CARE_PROVIDER_SITE_OTHER): Payer: Medicaid Other

## 2022-04-04 DIAGNOSIS — M25462 Effusion, left knee: Secondary | ICD-10-CM

## 2022-04-04 DIAGNOSIS — M25562 Pain in left knee: Secondary | ICD-10-CM

## 2022-04-04 MED ORDER — HYDROCODONE-ACETAMINOPHEN 5-325 MG PO TABS: 1.0000 | ORAL_TABLET | Freq: Two times a day (BID) | ORAL | 0 refills | Status: AC | PRN

## 2022-04-04 MED ORDER — IBUPROFEN 800 MG PO TABS: 800.0000 mg | ORAL_TABLET | Freq: Three times a day (TID) | ORAL | 0 refills | Status: DC

## 2022-04-04 NOTE — ED Triage Notes (Signed)
Pt reports since MOnday having left knee pains. Reports he was standing and turned to side and felt pop/burning in left knee area. Pt has pains that radiates up to left lower back area.

## 2022-04-04 NOTE — Discharge Instructions (Signed)
I am concerned about a ligament injury to your knee given the swelling and normal x-ray.  Please follow-up with orthopedics soon as possible; call to schedule an appointment.  Use ibuprofen 800 mg for pain.  Do not take NSAIDs including aspirin, ibuprofen/Advil, naproxen/Aleve with this medication as it can cause stomach bleeding.  Use Tylenol for breakthrough pain.  I have called in a few doses of hydrocodone.  These will make you sleepy so do not drive or drink alcohol with taking them.  Keep your leg elevated and use ice and knee brace for support.  If anything worsens go to the ER.

## 2022-04-04 NOTE — ED Provider Notes (Signed)
[facility]    CSN: [id number] Arrival date & time: 04/04/22  0801      History   Chief Complaint Chief Complaint  Patient presents with   Knee Pain    HPI Jesse Whitehead is a 61 y.o. male.   Patient presents today with a 2-day history of severe left knee pain following injury.  Reports that he was standing up when someone called his name and he turned quickly after which he felt a pop and significant pain in his lateral left knee.  He reports that pain is rated 6 on a 0-10 pain scale at rest but increases to 9/10 with attempted ambulation.  He reports significant associated swelling but denies any numbness or paresthesias.  He does report some pain that radiates into his lower back.  Denies previous injury or surgery involving this knee; has had issues with his right knee and generally ambulates with a cane.  Reports feelings of instability but is not fallen.  He reports some popping and catching.  He has tried ibuprofen with minimal improvement of symptoms.  He is having difficulty with daily activities as result of symptoms.  He is followed by orthopedics at the [facility] but has difficulty getting into see them regularly.    Past Medical History:  Diagnosis Date   Depression    Diabetes mellitus without complication ([facility])    Hypertension    Sleep apnea     There are no problems to display for this patient.   Past Surgical History:  Procedure Laterality Date   HAND SURGERY     HEMORRHOID SURGERY         Home Medications    Prior to Admission medications   Medication Sig Start Date End Date Taking? Authorizing Provider  HYDROcodone-acetaminophen (NORCO/VICODIN) 5-325 MG tablet Take 1 tablet by mouth 2 (two) times daily as needed for up to 2 days. 04/04/22 04/06/22 Yes Magdiel Bartles K, PA-C  atorvastatin (LIPITOR) 40 MG tablet TAKE ONE TABLET BY MOUTH AT BEDTIME FOR CHOLESTEROL 06/17/20 06/18/21  [provider]  chlorthalidone (HYGROTON) 25 MG tablet Take  0.5 tablets by mouth daily. 10/18/20 06/18/21  [provider]  cyclobenzaprine (FLEXERIL) 10 MG tablet Take 1 tablet (10 mg total) by mouth 3 (three) times daily as needed for muscle spasms. Patient not taking: Reported on 08/01/2021 12/08/20   Roxy Horseman, PA-C  DULoxetine (CYMBALTA) 30 MG capsule Take by mouth. 05/06/20   [provider]  gabapentin (NEURONTIN) 300 MG capsule TAKE ONE CAPSULE BY MOUTH EVERY MORNING AND AT NOON AND TAKE TWO CAPSULES AT BEDTIME 06/17/20 06/18/21  [provider]  gabapentin (NEURONTIN) 300 MG capsule Take 300 mg by mouth 3 (three) times daily.    [provider]  ibuprofen (ADVIL) 800 MG tablet Take 1 tablet (800 mg total) by mouth 3 (three) times daily. 04/04/22   Jashun Puertas, Denny Peon K, PA-C  insulin glargine (LANTUS) 100 UNIT/ML injection INJECT 32 UNITS SUBCUTANEOUSLY AT BEDTIME FOR DIABETES (USE WITHIN 28 DAYS AFTER OPENING VIAL) 10/18/20 10/19/21  [provider]  insulin regular (NOVOLIN R) 100 units/mL injection Inject 16 Units into the skin 3 (three) times daily before meals.    [provider]  lidocaine (LIDODERM) 5 % Place 1 patch onto the skin daily. Remove & Discard patch within 12 hours or as directed by MD 12/12/20   Haskel Schroeder, PA-C  losartan (COZAAR) 50 MG tablet Take 25 mg by mouth daily.    [provider]  pantoprazole (PROTONIX) 40 MG tablet Take by mouth. 12/27/20   [provider]  Semaglutide,0.25 or 0.5MG /DOS, 2 MG/1.5ML SOPN Inject into the skin. 11/15/20   [provider]  triamcinolone (NASACORT) 55 MCG/ACT AERO nasal inhaler Place 2 sprays into the nose daily. 08/01/21   Rhys Martini, PA-C    Family History Family History  Problem Relation Age of Onset   Hypertension Mother    Diabetes Mother     Social History Social History   Tobacco Use   Smoking status: Every Day    Packs/day: 0.25    Types: Cigarettes   Smokeless tobacco: Never  Vaping Use    Vaping Use: Never used  Substance Use Topics   Alcohol use: Yes    Comment: occ   Drug use: Yes    Types: Marijuana     Allergies   Patient has no known allergies.   Review of Systems Review of Systems  Constitutional:  Positive for activity change. Negative for appetite change, fatigue and fever.  Musculoskeletal:  Positive for arthralgias, back pain, gait problem and joint swelling. Negative for myalgias.  Neurological:  Negative for dizziness, weakness, light-headedness, numbness and headaches.     Physical Exam Triage Vital Signs ED Triage Vitals  Enc Vitals Group     BP 04/04/22 0818 139/82     Pulse Rate 04/04/22 0818 (!) 101     Resp 04/04/22 0818 (!) 22     Temp 04/04/22 0818 98.4 F (36.9 C)     Temp Source 04/04/22 0818 Oral     SpO2 04/04/22 0818 96 %     Weight --      Height --      Head Circumference --      Peak Flow --      Pain Score 04/04/22 0817 6     Pain Loc --      Pain Edu? --      Excl. in [place]? --    No data found.  Updated Vital Signs BP 139/82 (BP Location: Right Arm)   Pulse (!) 101   Temp 98.4 F (36.9 C) (Oral)   Resp (!) 22   SpO2 96%   Visual Acuity Right Eye Distance:   Left Eye Distance:   Bilateral Distance:    Right Eye Near:   Left Eye Near:    Bilateral Near:     Physical Exam Vitals reviewed.  Constitutional:      General: He is awake.     Appearance: Normal appearance. He is well-developed. He is not ill-appearing.     Comments: Very pleasant male appears stated age in no acute distress sitting comfortably in exam room table  HENT:     Head: Normocephalic and atraumatic.     Mouth/Throat:     Pharynx: No oropharyngeal exudate, posterior oropharyngeal erythema or uvula swelling.  Cardiovascular:     Rate and Rhythm: Normal rate and regular rhythm.     Heart sounds: Normal heart sounds, S1 normal and S2 normal. No murmur heard. Pulmonary:     Effort: Pulmonary effort is normal.     Breath sounds: Normal  breath sounds. No stridor. No wheezing, rhonchi or rales.     Comments: Clear to auscultation bilaterally Abdominal:     General: Bowel sounds are normal.     Palpations: Abdomen is soft.     Tenderness: There is no abdominal tenderness.  Musculoskeletal:     Left knee: Swelling and effusion present. Decreased range  of motion. Tenderness present over the lateral joint line. No LCL laxity, MCL laxity, ACL laxity or PCL laxity.    Instability Tests: Anterior drawer test negative. Posterior drawer test negative.     Comments: Left knee: Significant effusion noted.  Decreased range of motion with flexion.  Tender to palpation over lateral joint line without deformity.  No ligamentous laxity on exam though exam is limited due to pain and degree of swelling.  Neurological:     Mental Status: He is alert.  Psychiatric:        Behavior: Behavior is cooperative.      UC Treatments / Results  Labs (all labs ordered are listed, but only abnormal results are displayed) Labs Reviewed - No data to display  EKG   Radiology DG Knee Complete 4 Views Left  Result Date: 04/04/2022 CLINICAL DATA:  Left knee pain after injury. EXAM: LEFT KNEE - COMPLETE 4+ VIEW COMPARISON:  None Available. FINDINGS: Tricompartmental spurring with prominent medial compartmental loss of articular space compatible with moderate to prominent osteoarthritis. Large knee effusion in the suprapatellar bursa. Notable spurring at the attachment sites of the quadriceps and patellar tendons to the patella. Spurring of the tibial spine. No fracture is identified. Small ossicle along the distal quadriceps tendon region. Potential thickening of the proximal patellar tendon. IMPRESSION: 1. Large knee effusion. 2. Moderate to prominent osteoarthritis. 3. No fracture is identified. 4. If pain persists despite conservative therapy, MRI may be warranted for further characterization. Electronically Signed   By: Gaylyn Rong M.D.   On:  04/04/2022 08:52    Procedures Procedures (including critical care time)  Medications Ordered in UC Medications - No data to display  Initial Impression / Assessment and Plan / UC Course  I have reviewed the triage vital signs and the nursing notes.  Pertinent labs & imaging results that were available during my care of the patient were reviewed by me and considered in my medical decision making (see chart for details).     Concern for ligamentous injury given clinical presentation.  X-ray was obtained that showed no acute abnormality but did show effusion.  Recommended conservative treatment measures including RICE protocol.  Recommended that he keep knee elevated and to use a brace.  Reports that he has a brace at home and will use this and declined when in clinic today.  He was given ibuprofen 800 mg to be used up to 3 times a day as needed.  Discussed that he should not take additional NSAIDs with this.  He reports pain is unbearable and was given a few doses of hydrocodone.  Discussed that this is sedating and he should not drive or drink alcohol with taking it.  Review of [place] controlled substance database shows no inappropriate refills.  Discussed the importance of following up with orthopedics as he likely needs an MRI.  He was given contact information for provider on-call with instruction to call to schedule an appointment soon as possible.  If he has any worsening symptoms he needs to be seen immediately.  Strict return precautions given to which he expressed understanding.  Final Clinical Impressions(s) / UC Diagnoses   Final diagnoses:  Acute pain of left knee  Effusion of left knee     Discharge Instructions      I am concerned about a ligament injury to your knee given the swelling and normal x-ray.  Please follow-up with orthopedics soon as possible; call to schedule an appointment.  Use ibuprofen  800 mg for pain.  Do not take NSAIDs including aspirin,  ibuprofen/Advil, naproxen/Aleve with this medication as it can cause stomach bleeding.  Use Tylenol for breakthrough pain.  I have called in a few doses of hydrocodone.  These will make you sleepy so do not drive or drink alcohol with taking them.  Keep your leg elevated and use ice and knee brace for support.  If anything worsens go to the ER.     ED Prescriptions     Medication Sig Dispense Auth. Provider   ibuprofen (ADVIL) 800 MG tablet Take 1 tablet (800 mg total) by mouth 3 (three) times daily. 21 tablet Stacee Earp K, PA-C   HYDROcodone-acetaminophen (NORCO/VICODIN) 5-325 MG tablet Take 1 tablet by mouth 2 (two) times daily as needed for up to 2 days. 4 tablet Jatara Huettner K, PA-C      I have reviewed the PDMP during this encounter.   Jeani Hawking, PA-C 04/04/22 2409

## 2022-07-14 ENCOUNTER — Emergency Department (HOSPITAL_COMMUNITY)
Admission: EM | Admit: 2022-07-14 | Discharge: 2022-07-14 | Disposition: A | Discharge status: Home / Self Care | Payer: Medicaid Other | Attending: Emergency Medicine | Admitting: Emergency Medicine

## 2022-07-14 ENCOUNTER — Emergency Department (HOSPITAL_COMMUNITY): Payer: Medicaid Other

## 2022-07-14 ENCOUNTER — Other Ambulatory Visit: Payer: Self-pay

## 2022-07-14 ENCOUNTER — Encounter (HOSPITAL_COMMUNITY): Payer: Self-pay

## 2022-07-14 DIAGNOSIS — S161XXA Strain of muscle, fascia and tendon at neck level, initial encounter: Secondary | ICD-10-CM | POA: Diagnosis not present

## 2022-07-14 DIAGNOSIS — S169XXA Unspecified injury of muscle, fascia and tendon at neck level, initial encounter: Secondary | ICD-10-CM | POA: Diagnosis present

## 2022-07-14 DIAGNOSIS — S66912A Strain of unspecified muscle, fascia and tendon at wrist and hand level, left hand, initial encounter: Secondary | ICD-10-CM | POA: Diagnosis not present

## 2022-07-14 DIAGNOSIS — S39012A Strain of muscle, fascia and tendon of lower back, initial encounter: Secondary | ICD-10-CM | POA: Insufficient documentation

## 2022-07-14 DIAGNOSIS — Y92410 Unspecified street and highway as the place of occurrence of the external cause: Secondary | ICD-10-CM | POA: Insufficient documentation

## 2022-07-14 DIAGNOSIS — Z794 Long term (current) use of insulin: Secondary | ICD-10-CM | POA: Insufficient documentation

## 2022-07-14 DIAGNOSIS — M79642 Pain in left hand: Secondary | ICD-10-CM | POA: Insufficient documentation

## 2022-07-14 DIAGNOSIS — I10 Essential (primary) hypertension: Secondary | ICD-10-CM

## 2022-07-14 DIAGNOSIS — S6392XA Sprain of unspecified part of left wrist and hand, initial encounter: Secondary | ICD-10-CM | POA: Diagnosis not present

## 2022-07-14 MED ORDER — METHOCARBAMOL 750 MG PO TABS: 750.0000 mg | ORAL_TABLET | Freq: Three times a day (TID) | ORAL | 0 refills | Status: DC | PRN

## 2022-07-14 MED ORDER — ACETAMINOPHEN 500 MG PO TABS
1000.0000 mg | ORAL_TABLET | Freq: Once | ORAL | Status: DC
  Filled 2022-07-14: qty 2

## 2022-07-14 MED ORDER — IBUPROFEN 200 MG PO TABS
400.0000 mg | ORAL_TABLET | Freq: Once | ORAL | Status: AC
  Administered 2022-07-14: 400 mg via ORAL
  Filled 2022-07-14: qty 2

## 2022-07-14 NOTE — Progress Notes (Signed)
Orthopedic Tech Progress Note Patient Details:  Jesse Whitehead Oct 28, 1960 [id number]  Ortho Devices Type of Ortho Device: Thumb velcro splint Ortho Device/Splint Location: LUE Ortho Device/Splint Interventions: Ordered, Application, Adjustment   Post Interventions Patient Tolerated: Well Instructions Provided: Care of device  Janit Pagan 07/14/2022, 2:00 PM
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
## 2022-07-14 NOTE — Discharge Instructions (Signed)
It was our pleasure to provide your ER care today - we hope that you feel better.  Take ibuprofen or naprosyn as need. You may also take robaxin as need for muscle pain/spasm - no driving when taking.   Follow up with primary care doctor in two weeks if symptoms fail to improve/resolve. Also follow up regarding your blood pressure that is mildly high today.  Return to ER if worse, new symptoms, new/severe pain, severe headache, trouble breathing, severe abdominal pain, or other concern.
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
## 2023-03-18 ENCOUNTER — Other Ambulatory Visit (HOSPITAL_BASED_OUTPATIENT_CLINIC_OR_DEPARTMENT_OTHER): Payer: Self-pay

## 2023-03-18 ENCOUNTER — Emergency Department (HOSPITAL_BASED_OUTPATIENT_CLINIC_OR_DEPARTMENT_OTHER)
Admission: EM | Admit: 2023-03-18 | Discharge: 2023-03-18 | Disposition: A | Discharge status: Home / Self Care | Payer: Medicaid Other | Attending: Emergency Medicine | Admitting: Emergency Medicine

## 2023-03-18 ENCOUNTER — Emergency Department (HOSPITAL_BASED_OUTPATIENT_CLINIC_OR_DEPARTMENT_OTHER): Payer: Medicaid Other

## 2023-03-18 ENCOUNTER — Encounter (HOSPITAL_BASED_OUTPATIENT_CLINIC_OR_DEPARTMENT_OTHER): Payer: Self-pay

## 2023-03-18 ENCOUNTER — Other Ambulatory Visit: Payer: Self-pay

## 2023-03-18 DIAGNOSIS — Z794 Long term (current) use of insulin: Secondary | ICD-10-CM | POA: Insufficient documentation

## 2023-03-18 DIAGNOSIS — E109 Type 1 diabetes mellitus without complications: Secondary | ICD-10-CM | POA: Diagnosis not present

## 2023-03-18 DIAGNOSIS — I10 Essential (primary) hypertension: Secondary | ICD-10-CM | POA: Insufficient documentation

## 2023-03-18 DIAGNOSIS — R0781 Pleurodynia: Secondary | ICD-10-CM | POA: Insufficient documentation

## 2023-03-18 DIAGNOSIS — R1012 Left upper quadrant pain: Secondary | ICD-10-CM | POA: Diagnosis not present

## 2023-03-18 LAB — CBC WITH DIFFERENTIAL/PLATELET
Abs Immature Granulocytes: 0.04 10*3/uL (ref 0.00–0.07)
Basophils Absolute: 0 10*3/uL (ref 0.0–0.1)
Basophils Relative: 1 %
Eosinophils Absolute: 0.1 10*3/uL (ref 0.0–0.5)
Eosinophils Relative: 1 %
HCT: 42.4 % (ref 39.0–52.0)
Hemoglobin: 14.4 g/dL (ref 13.0–17.0)
Immature Granulocytes: 1 %
Lymphocytes Relative: 21 %
Lymphs Abs: 1.4 10*3/uL (ref 0.7–4.0)
MCH: 31.7 pg (ref 26.0–34.0)
MCHC: 34 g/dL (ref 30.0–36.0)
MCV: 93.4 fL (ref 80.0–100.0)
Monocytes Absolute: 0.7 10*3/uL (ref 0.1–1.0)
Monocytes Relative: 11 %
Neutro Abs: 4.6 10*3/uL (ref 1.7–7.7)
Neutrophils Relative %: 65 %
Platelets: 197 10*3/uL (ref 150–400)
RBC: 4.54 MIL/uL (ref 4.22–5.81)
RDW: 12.6 % (ref 11.5–15.5)
WBC: 7 10*3/uL (ref 4.0–10.5)
nRBC: 0 % (ref 0.0–0.2)

## 2023-03-18 LAB — COMPREHENSIVE METABOLIC PANEL
ALT: 20 U/L (ref 0–44)
AST: 21 U/L (ref 15–41)
Albumin: 3.9 g/dL (ref 3.5–5.0)
Alkaline Phosphatase: 83 U/L (ref 38–126)
Anion gap: 9 (ref 5–15)
BUN: 16 mg/dL (ref 8–23)
CO2: 27 mmol/L (ref 22–32)
Calcium: 9.5 mg/dL (ref 8.9–10.3)
Chloride: 100 mmol/L (ref 98–111)
Creatinine, Ser: 0.88 mg/dL (ref 0.61–1.24)
GFR, Estimated: >60 mL/min (ref >60)
Glucose, Bld: 204 mg/dL — ABNORMAL HIGH (ref 70–99)
Potassium: 3.9 mmol/L (ref 3.5–5.1)
Sodium: 136 mmol/L (ref 135–145)
Total Bilirubin: 0.5 mg/dL (ref 0.3–1.2)
Total Protein: 6.8 g/dL (ref 6.5–8.1)

## 2023-03-18 LAB — LACTIC ACID, PLASMA
Lactic Acid, Venous: 2.2 mmol/L (ref 0.5–1.9)
Lactic Acid, Venous: 1.8 mmol/L (ref 0.5–1.9)

## 2023-03-18 MED ORDER — MORPHINE SULFATE (PF) 4 MG/ML IV SOLN
4.0000 mg | Freq: Once | INTRAVENOUS | Status: AC
  Administered 2023-03-18: 4 mg via INTRAVENOUS
  Filled 2023-03-18: qty 1

## 2023-03-18 MED ORDER — CYCLOBENZAPRINE HCL 10 MG PO TABS: 10.0000 mg | ORAL_TABLET | Freq: Two times a day (BID) | ORAL | 0 refills | Status: AC | PRN

## 2023-03-18 MED ORDER — SODIUM CHLORIDE 0.9 % IV BOLUS
1000.0000 mL | Freq: Once | INTRAVENOUS | Status: AC
Start: 2023-03-18 — End: 2023-03-18
  Administered 2023-03-18: 1000 mL via INTRAVENOUS

## 2023-03-18 MED ORDER — IBUPROFEN 600 MG PO TABS: 600.0000 mg | ORAL_TABLET | Freq: Four times a day (QID) | ORAL | 0 refills | Status: AC | PRN

## 2023-03-18 MED ORDER — IOHEXOL 300 MG/ML  SOLN
100.0000 mL | Freq: Once | INTRAMUSCULAR | Status: AC | PRN
  Administered 2023-03-18: 100 mL via INTRAVENOUS

## 2023-03-18 NOTE — ED Notes (Signed)
Patient appears to be sleepy post pain medication. He stated that he wears a cpap at bedtime. Patient does not want to put a CPAP on right now to nap. A nasal cannula @ 2lpm was placed in the room incase patient oxygen saturation decreases while sleeping

## 2023-03-18 NOTE — ED Provider Notes (Signed)
[place] EMERGENCY DEPARTMENT AT [facility] Provider Note   CSN: [id number] Arrival date & time: 03/18/23  1038     History  Chief Complaint  Patient presents with   Abdominal Pain    Jesse Whitehead is a 62 y.o. male.   Abdominal Pain   62 year old male presents emergency department with complaints of left upper quadrant abdominal pain/left lower rib pain.  Patient states that he was sitting on the edge of his bed approximately 20 to 30 minutes prior to arrival.  States that he had a "sneezing attack" where he sneezed 3-4 times and felt a popping sensation in his left upper abdomen with subsequent pain.  States the pain is worsened with movement/taking a deep breath and relieved with rest.  Past medical history significant for hypertension, diabetes mellitus insulin-dependent, depression, OSA, hyperlipidemia  Home Medications Prior to Admission medications   Medication Sig Start Date End Date Taking? Authorizing Provider  cyclobenzaprine (FLEXERIL) 10 MG tablet Take 1 tablet (10 mg total) by mouth 2 (two) times daily as needed for muscle spasms. 03/18/23  Yes Sherian Maroon A, PA  ibuprofen (ADVIL) 600 MG tablet Take 1 tablet (600 mg total) by mouth every 6 (six) hours as needed. 03/18/23  Yes Sherian Maroon A, PA  atorvastatin (LIPITOR) 40 MG tablet TAKE ONE TABLET BY MOUTH AT BEDTIME FOR CHOLESTEROL 06/17/20 06/18/21  [provider]  chlorthalidone (HYGROTON) 25 MG tablet Take 0.5 tablets by mouth daily. 10/18/20 06/18/21  [provider]  DULoxetine (CYMBALTA) 30 MG capsule Take by mouth. 05/06/20   [provider]  gabapentin (NEURONTIN) 300 MG capsule TAKE ONE CAPSULE BY MOUTH EVERY MORNING AND AT NOON AND TAKE TWO CAPSULES AT BEDTIME 06/17/20 06/18/21  [provider]  gabapentin (NEURONTIN) 300 MG capsule Take 300 mg by mouth 3 (three) times daily.    [provider]  insulin glargine (LANTUS) 100 UNIT/ML injection INJECT 32  UNITS SUBCUTANEOUSLY AT BEDTIME FOR DIABETES (USE WITHIN 28 DAYS AFTER OPENING VIAL) 10/18/20 10/19/21  [provider]  insulin regular (NOVOLIN R) 100 units/mL injection Inject 16 Units into the skin 3 (three) times daily before meals.    [provider]  lidocaine (LIDODERM) 5 % Place 1 patch onto the skin daily. Remove & Discard patch within 12 hours or as directed by MD 12/12/20   Haskel Schroeder, PA-C  losartan (COZAAR) 50 MG tablet Take 25 mg by mouth daily.    [provider]  pantoprazole (PROTONIX) 40 MG tablet Take by mouth. 12/27/20   [provider]  Semaglutide,0.25 or 0.5MG /DOS, 2 MG/1.5ML SOPN Inject into the skin. 11/15/20   [provider]  triamcinolone (NASACORT) 55 MCG/ACT AERO nasal inhaler Place 2 sprays into the nose daily. 08/01/21   Rhys Martini, PA-C      Allergies    Patient has no known allergies.    Review of Systems   Review of Systems  Gastrointestinal:  Positive for abdominal pain.  All other systems reviewed and are negative.   Physical Exam Updated Vital Signs BP (!) 135/96   Pulse 82   Temp 97.8 F (36.6 C) (Oral)   Resp 15   Ht 5\' 8"  (1.727 m)   Wt 131.5 kg   SpO2 94%   BMI 44.09 kg/m  Physical Exam Vitals and nursing note reviewed.  Constitutional:      General: He is not in acute distress.    Appearance: He is well-developed.  HENT:  Head: Normocephalic and atraumatic.  Eyes:     Conjunctiva/sclera: Conjunctivae normal.  Cardiovascular:     Rate and Rhythm: Normal rate and regular rhythm.     Heart sounds: No murmur heard. Pulmonary:     Effort: Pulmonary effort is normal. No respiratory distress.     Breath sounds: Normal breath sounds. No wheezing, rhonchi or rales.     Comments: Breath sounds auscultated bilateral lung fields.  Tender to palpation left lower ribs anteriorly and laterally Abdominal:     Palpations: Abdomen is soft.     Tenderness: There is abdominal tenderness in  the left upper quadrant. There is no right CVA tenderness, left CVA tenderness or guarding. Negative signs include Murphy's sign and McBurney's sign.     Comments: Mild left upper quadrant tenderness to palpation  Musculoskeletal:        General: No swelling.     Cervical back: Neck supple.  Skin:    General: Skin is warm and dry.     Capillary Refill: Capillary refill takes less than 2 seconds.  Neurological:     Mental Status: He is alert.  Psychiatric:        Mood and Affect: Mood normal.     ED Results / Procedures / Treatments   Labs (all labs ordered are listed, but only abnormal results are displayed) Labs Reviewed  COMPREHENSIVE METABOLIC PANEL - Abnormal; Notable for the following components:      Result Value   Glucose, Bld 204 (*)    All other components within normal limits  LACTIC ACID, PLASMA - Abnormal; Notable for the following components:   Lactic Acid, Venous 2.2 (*)    All other components within normal limits  CBC WITH DIFFERENTIAL/PLATELET  LACTIC ACID, PLASMA    EKG EKG Interpretation Date/Time:  Monday March 18 2023 10:59:25 EDT Ventricular Rate:  90 PR Interval:  156 QRS Duration:  111 QT Interval:  360 QTC Calculation: 441 R Axis:   52  Text Interpretation: Sinus rhythm Confirmed by Alvester Chou [phone]) on 03/18/2023 11:20:58 AM  Radiology CT CHEST ABDOMEN PELVIS W CONTRAST  Result Date: 03/18/2023 CLINICAL DATA:  Left upper quadrant abdominal pain, sudden pain after a sneezing attack EXAM: CT CHEST, ABDOMEN, AND PELVIS WITH CONTRAST TECHNIQUE: Multidetector CT imaging of the chest, abdomen and pelvis was performed following the standard protocol during bolus administration of intravenous contrast. RADIATION DOSE REDUCTION: This exam was performed according to the departmental dose-optimization program which includes automated exposure control, adjustment of the mA and/or kV according to patient size and/or use of iterative reconstruction  technique. CONTRAST:  OMNIPAQUE IOHEXOL 300 MG/ML  SOLN COMPARISON:  CT abdomen pelvis, 12/11/2018 FINDINGS: CT CHEST FINDINGS Cardiovascular: Aortic atherosclerosis. Normal heart size. No pericardial effusion. Mediastinum/Nodes: No enlarged mediastinal, hilar, or axillary lymph nodes. Thyroid gland, trachea, and esophagus demonstrate no significant findings. Lungs/Pleura: Mild dependent bibasilar scarring or atelectasis. No pleural effusion or pneumothorax. Musculoskeletal: No chest wall abnormality. No acute osseous findings. CT ABDOMEN PELVIS FINDINGS Hepatobiliary: Geographic hepatic steatosis. Hepatomegaly, maximum coronal span 21.1 cm. Status post cholecystectomy. No biliary dilatation. Pancreas: Unremarkable. No pancreatic ductal dilatation or surrounding inflammatory changes. Spleen: Normal in size without significant abnormality. Adrenals/Urinary Tract: Adrenal glands are unremarkable. Multiple small bilateral nonobstructive renal calculi. No ureteral calculi or hydronephrosis. Bladder is unremarkable. Stomach/Bowel: Stomach is within normal limits. Appendix appears normal. No evidence of bowel wall thickening, distention, or inflammatory changes. Vascular/Lymphatic: Aortic atherosclerosis. No enlarged abdominal or pelvic lymph nodes. Reproductive: No  mass or other abnormality. Other: No abdominal wall hernia or abnormality. No ascites. Musculoskeletal: No acute osseous findings. IMPRESSION: 1. No acute CT findings of the chest, abdomen, or pelvis to explain left upper quadrant abdominal pain. 2. Geographic hepatic steatosis and hepatomegaly. 3. Multiple small bilateral nonobstructive renal calculi. No ureteral calculi or hydronephrosis. Aortic Atherosclerosis (ICD10-I70.0). Electronically Signed   By: Jearld Lesch M.D.   On: 03/18/2023 13:28    Procedures Procedures    Medications Ordered in ED Medications  morphine (PF) 4 MG/ML injection 4 mg (4 mg Intravenous Given 03/18/23 1132)  iohexol  (OMNIPAQUE) 300 MG/ML solution 100 mL (100 mLs Intravenous Contrast Given 03/18/23 1234)  sodium chloride 0.9 % bolus 1,000 mL (0 mLs Intravenous Stopped 03/18/23 1456)    ED Course/ Medical Decision Making/ A&P                             Medical Decision Making Amount and/or Complexity of Data Reviewed Labs: ordered. Radiology: ordered.  Risk Prescription drug management.   This patient presents to the ED for concern of left upper abdominal pain, left rib pain, this involves an extensive number of treatment options, and is a complaint that carries with it a high risk of complications and morbidity.  The differential diagnosis includes fracture, dislocation, strain/sprain, SBO/LBO, hernia, volvulus   Co morbidities that complicate the patient evaluation  See HPI   Additional history obtained:  Additional history obtained from EMR External records from outside source obtained and reviewed including hospital records   Lab Tests:  I Ordered, and personally interpreted labs.  The pertinent results include: No leukocytosis noted.  No evidence of anemia.  Platelets within range.  No electrolyte abnormalities.  No transaminitis.  No renal dysfunction.  Initial lactic elevated 2.2 with repeat 1.8   Imaging Studies ordered:  I ordered imaging studies including CT chest abdomen and pelvis I independently visualized and interpreted imaging which showed no acute finding and CT chest abdomen pelvis.  Geographic hepatic steatosis and hepatomegaly.  Multiple small bilateral nonobstructing renal calculi.  No ureteral calculi or hydronephrosis.  Aortic atherosclerosis. I agree with the radiologist interpretation  Cardiac Monitoring: / EKG:  The patient was maintained on a cardiac monitor.  I personally viewed and interpreted the cardiac monitored which showed an underlying rhythm of: Sinus rhythm   Consultations Obtained:  I requested consultation with attending physician Dr. Renaye Rakers who  is in agreement with treatment plan going forward  Problem List / ED Course / Critical interventions / Medication management  Left lower chest pain  I ordered medication including morphine, 1 L normal saline   Reevaluation of the patient after these medicines showed that the patient improved I have reviewed the patients home medicines and have made adjustments as needed   Social Determinants of Health:  Chronic cigarette use.  Denies illicit drug use.   Test / Admission - Considered:  Left lower chest pain Vitals signs significant for hypertension with blood pressure 156/92. Otherwise within normal range and stable throughout visit. Laboratory/imaging studies significant for: See above 62 year old male presents emergency department with complaints of left upper abdominal pain/left lower chest pain after "sneezing attack" earlier today.  Patient with mostly left lower rib/chest wall tenderness to palpation but with some left upper quadrant tenderness to palpation on exam.  Some concern initially for pneumothorax versus fracture/dislocated rib versus abdominal hernia, etc.  Given both abdominal and rib tenderness to palpation, CT  imaging was pursued which was negative for any acute abnormality.  Doubt pneumothorax, hernia, acute fracture/dislocated rib.  I suspect the patient's symptoms are mainly musculoskeletal in etiology.  Patient noted significant improvement of symptoms with administration of medications while in the emergency department.  Will send home with anti-inflammatory medication as well as recommend continue use of incentive spirometry.  Close follow-up with primary care recommended within 2 to 3 days for reevaluation of symptoms.  Treatment plan discussed at length with patient and he acknowledged understanding was agreeable to said plan.  Patient overall well-appearing, afebrile in no acute distress. Worrisome signs and symptoms were discussed with the patient, and the patient  acknowledged understanding to return to the ED if noticed. Patient was stable upon discharge.          Final Clinical Impression(s) / ED Diagnoses Final diagnoses:  Rib pain on left side    Rx / DC Orders ED Discharge Orders          Ordered    ibuprofen (ADVIL) 600 MG tablet  Every 6 hours PRN        03/18/23 1425    cyclobenzaprine (FLEXERIL) 10 MG tablet  2 times daily PRN        03/18/23 1425              Peter Garter, [place] 03/18/23 1519    Terald Sleeper, MD 03/18/23 [phone]

## 2023-03-18 NOTE — ED Notes (Signed)
Patient was set up on IS. Showed a good effort

## 2023-03-18 NOTE — ED Triage Notes (Signed)
Reports was about to go to therapy and had a sneezing attack and suddenly felt a pop in left upper quad.  Patient moaning in triage and tearful.  Bilateral radial pulses equal.  No n/v

## 2023-03-18 NOTE — Discharge Instructions (Addendum)
As discussed, CT imaging was negative for any rib fracture or abdominal abnormality.  We will treat his symptoms at home with anti-inflammatory medication as well as muscle laxer to use as needed.  Recommend follow-up with primary care for reassessment of your symptoms.  Please do not hesitate to return to emergency department for worrisome signs and symptoms we discussed become apparent.

## 2023-06-12 ENCOUNTER — Other Ambulatory Visit: Payer: Self-pay

## 2023-06-12 ENCOUNTER — Emergency Department (HOSPITAL_BASED_OUTPATIENT_CLINIC_OR_DEPARTMENT_OTHER)
Admission: EM | Admit: 2023-06-12 | Discharge: 2023-06-12 | Disposition: A | Discharge status: Home / Self Care | Payer: Medicaid Other | Attending: Emergency Medicine | Admitting: Emergency Medicine

## 2023-06-12 ENCOUNTER — Telehealth: Payer: Self-pay | Admitting: *Deleted

## 2023-06-12 ENCOUNTER — Encounter (HOSPITAL_BASED_OUTPATIENT_CLINIC_OR_DEPARTMENT_OTHER): Payer: Self-pay | Admitting: Emergency Medicine

## 2023-06-12 DIAGNOSIS — Z20822 Contact with and (suspected) exposure to covid-19: Secondary | ICD-10-CM | POA: Insufficient documentation

## 2023-06-12 DIAGNOSIS — E119 Type 2 diabetes mellitus without complications: Secondary | ICD-10-CM | POA: Diagnosis not present

## 2023-06-12 DIAGNOSIS — Z794 Long term (current) use of insulin: Secondary | ICD-10-CM | POA: Insufficient documentation

## 2023-06-12 DIAGNOSIS — R051 Acute cough: Secondary | ICD-10-CM | POA: Insufficient documentation

## 2023-06-12 DIAGNOSIS — M7918 Myalgia, other site: Secondary | ICD-10-CM | POA: Insufficient documentation

## 2023-06-12 DIAGNOSIS — R059 Cough, unspecified: Secondary | ICD-10-CM | POA: Diagnosis present

## 2023-06-12 LAB — SARS CORONAVIRUS 2 BY RT PCR: SARS Coronavirus 2 by RT PCR: NEGATIVE

## 2023-06-12 NOTE — ED Provider Notes (Signed)
[place] EMERGENCY DEPARTMENT AT [facility] Provider Note   CSN: [id number] Arrival date & time: 06/12/23  1022     History  Chief Complaint  Patient presents with   Cough    Zach Dinger is a 62 y.o. male.  The history is provided by the patient.   Patient presents for COVID test.  He reports that a family member has COVID.  He has had mild cough and bodyaches.  No fevers or vomiting.  No chest pain or shortness of breath.  He is a smoker and is not interested in quitting. The symptoms have been ongoing about 3 days    Home Medications Prior to Admission medications   Medication Sig Start Date End Date Taking? Authorizing Provider  atorvastatin (LIPITOR) 40 MG tablet TAKE ONE TABLET BY MOUTH AT BEDTIME FOR CHOLESTEROL 06/17/20 06/18/21  [provider]  chlorthalidone (HYGROTON) 25 MG tablet Take 0.5 tablets by mouth daily. 10/18/20 06/18/21  [provider]  cyclobenzaprine (FLEXERIL) 10 MG tablet Take 1 tablet (10 mg total) by mouth 2 (two) times daily as needed for muscle spasms. 03/18/23   Peter Garter, PA  DULoxetine (CYMBALTA) 30 MG capsule Take by mouth. 05/06/20   [provider]  gabapentin (NEURONTIN) 300 MG capsule TAKE ONE CAPSULE BY MOUTH EVERY MORNING AND AT NOON AND TAKE TWO CAPSULES AT BEDTIME 06/17/20 06/18/21  [provider]  gabapentin (NEURONTIN) 300 MG capsule Take 300 mg by mouth 3 (three) times daily.    [provider]  ibuprofen (ADVIL) 600 MG tablet Take 1 tablet (600 mg total) by mouth every 6 (six) hours as needed. 03/18/23   Sherian Maroon A, PA  insulin glargine (LANTUS) 100 UNIT/ML injection INJECT 32 UNITS SUBCUTANEOUSLY AT BEDTIME FOR DIABETES (USE WITHIN 28 DAYS AFTER OPENING VIAL) 10/18/20 10/19/21  [provider]  insulin regular (NOVOLIN R) 100 units/mL injection Inject 16 Units into the skin 3 (three) times daily before meals.    [provider]  lidocaine (LIDODERM) 5 %  Place 1 patch onto the skin daily. Remove & Discard patch within 12 hours or as directed by MD 12/12/20   Haskel Schroeder, PA-C  losartan (COZAAR) 50 MG tablet Take 25 mg by mouth daily.    [provider]  pantoprazole (PROTONIX) 40 MG tablet Take by mouth. 12/27/20   [provider]  Semaglutide,0.25 or 0.5MG /DOS, 2 MG/1.5ML SOPN Inject into the skin. 11/15/20   [provider]  triamcinolone (NASACORT) 55 MCG/ACT AERO nasal inhaler Place 2 sprays into the nose daily. 08/01/21   Rhys Martini, PA-C      Allergies    Patient has no known allergies.    Review of Systems   Review of Systems  Respiratory:  Positive for cough.     Physical Exam Updated Vital Signs BP (!) 172/103   Pulse (!) 102   Temp 98.8 F (37.1 C) (Oral)   Resp 20   Ht 1.727 m (5\' 8" )   Wt 134.3 kg   SpO2 98%   BMI 45.01 kg/m  Physical Exam CONSTITUTIONAL: Well developed/well nourished HEAD: Normocephalic/atraumatic EYES: EOMI/PERRL ENMT: Mucous membranes moist, uvula midline, no exudates.  Mild erythema noted, no edema NECK: supple no meningeal signs CV: S1/S2 noted, no murmurs/rubs/gallops noted LUNGS: Lungs are clear to auscultation bilaterally, no apparent distress NEURO: Pt is awake/alert/appropriate, moves all extremitiesx4.  No facial droop.   EXTREMITIES: pulses normal/equal, full ROM SKIN: warm, color normal PSYCH: no abnormalities of  mood noted, alert and oriented to situation  ED Results / Procedures / Treatments   Labs (all labs ordered are listed, but only abnormal results are displayed) Labs Reviewed  SARS CORONAVIRUS 2 BY RT PCR    EKG None  Radiology No results found.  Procedures Procedures    Medications Ordered in ED Medications - No data to display  ED Course/ Medical Decision Making/ A&P                                 Medical Decision Making  Patient is in no acute distress.  Requesting COVID test only.  Lung sounds are  clear. Denies chest pain/shortness of breath/hemoptysis Patient not interested in quitting smoking Will discharge home and he can follow-up with his results        Final Clinical Impression(s) / ED Diagnoses Final diagnoses:  Acute cough    Rx / DC Orders ED Discharge Orders     None         Zadie Rhine, MD 06/12/23 1050

## 2023-06-12 NOTE — ED Notes (Addendum)
Pt given discharge instructions. Opportunities given for questions. Pt verbalizes understanding. COVID swab sent to lab. Registration assisting patient with MyChart set-up to follow-up for COVID test results.   Jillyn Hidden, RN

## 2023-06-12 NOTE — Telephone Encounter (Signed)
Patient called for test results- in MyChart: Notified negative COVID-      Component Ref Range & Units 10:49  SARS Coronavirus 2 by RT PCR NEGATIVE NEGATIVE

## 2023-06-12 NOTE — ED Notes (Signed)
ED Provider at bedside. 

## 2023-06-12 NOTE — ED Triage Notes (Signed)
Pt arrives pov, slow gait with c/o cough, body aches. Family member tested positive for covid, requesting to be tested.

## 2023-06-12 NOTE — ED Notes (Addendum)
correction

## 2023-07-30 ENCOUNTER — Other Ambulatory Visit: Payer: Self-pay

## 2023-07-30 ENCOUNTER — Emergency Department (HOSPITAL_BASED_OUTPATIENT_CLINIC_OR_DEPARTMENT_OTHER)
Admission: EM | Admit: 2023-07-30 | Discharge: 2023-07-30 | Disposition: A | Discharge status: Home / Self Care | Payer: Medicaid Other | Attending: Emergency Medicine | Admitting: Emergency Medicine

## 2023-07-30 ENCOUNTER — Encounter (HOSPITAL_BASED_OUTPATIENT_CLINIC_OR_DEPARTMENT_OTHER): Payer: Self-pay | Admitting: Emergency Medicine

## 2023-07-30 DIAGNOSIS — Z7984 Long term (current) use of oral hypoglycemic drugs: Secondary | ICD-10-CM | POA: Insufficient documentation

## 2023-07-30 DIAGNOSIS — L02415 Cutaneous abscess of right lower limb: Secondary | ICD-10-CM | POA: Insufficient documentation

## 2023-07-30 DIAGNOSIS — L03115 Cellulitis of right lower limb: Secondary | ICD-10-CM | POA: Insufficient documentation

## 2023-07-30 DIAGNOSIS — Z794 Long term (current) use of insulin: Secondary | ICD-10-CM | POA: Diagnosis not present

## 2023-07-30 DIAGNOSIS — L02419 Cutaneous abscess of limb, unspecified: Secondary | ICD-10-CM

## 2023-07-30 DIAGNOSIS — R2241 Localized swelling, mass and lump, right lower limb: Secondary | ICD-10-CM | POA: Diagnosis present

## 2023-07-30 DIAGNOSIS — E119 Type 2 diabetes mellitus without complications: Secondary | ICD-10-CM | POA: Insufficient documentation

## 2023-07-30 MED ORDER — LIDOCAINE-EPINEPHRINE-TETRACAINE (LET) TOPICAL GEL
3.0000 mL | Freq: Once | TOPICAL | Status: AC
  Administered 2023-07-30: 3 mL via TOPICAL
  Filled 2023-07-30: qty 3

## 2023-07-30 MED ORDER — BACITRACIN ZINC 500 UNIT/GM EX OINT: TOPICAL_OINTMENT | Freq: Two times a day (BID) | CUTANEOUS | Status: DC

## 2023-07-30 MED ORDER — BACITRACIN ZINC 500 UNIT/GM EX OINT: 1.0000 | TOPICAL_OINTMENT | Freq: Two times a day (BID) | CUTANEOUS | 0 refills | Status: AC

## 2023-07-30 MED ORDER — CEPHALEXIN 500 MG PO CAPS: 500.0000 mg | ORAL_CAPSULE | Freq: Four times a day (QID) | ORAL | 0 refills | Status: DC

## 2023-07-30 MED ORDER — LIDOCAINE-EPINEPHRINE (PF) 2 %-1:200000 IJ SOLN
10.0000 mL | Freq: Once | INTRAMUSCULAR | Status: AC
Start: 2023-07-30 — End: 2023-07-30
  Administered 2023-07-30: 10 mL
  Filled 2023-07-30: qty 20

## 2023-07-30 NOTE — Discharge Instructions (Addendum)
We saw you in the ER for your WOUND. Please read the instructions provided. Keep the area clean and dry, apply bacitracin ointment daily and take the medications provided. RETURN TO THE ER IF THERE IS INCREASED PAIN, REDNESS, PUS COMING OUT from the wound site.

## 2023-07-30 NOTE — ED Triage Notes (Signed)
About 2 weeks ago, pt awoke and found a small area to his right shin that had some pus in it, it eventually popped , pus came out and was better. It then returned and now is hard underneath. No fevers. Pt is diabetic

## 2023-07-30 NOTE — ED Provider Notes (Signed)
[place] EMERGENCY DEPARTMENT AT [facility] Provider Note   CSN: [id number] Arrival date & time: 07/30/23  7829     History  No chief complaint on file.   Jesse Whitehead is a 62 y.o. male.  HPI    62 year old patient comes in with chief complaint of increasing swelling and soreness to his right leg.  Patient stated about 2 weeks ago he noted a small insect bite type lesion to his right shin.  Patient is diabetic, he has been keeping a close eye on that lesion.  He noted that there was some purulent drainage soon after while he was cleaning the wound in the shower.  Subsequently, there is been increased swelling, increased soreness in his right foot but no drainage.  Patient denies any fevers, chills.  No history of DVT.  Home Medications Prior to Admission medications   Medication Sig Start Date End Date Taking? Authorizing Provider  bacitracin ointment Apply 1 Application topically 2 (two) times daily. 07/30/23  Yes Amay Mijangos, MD  cephALEXin (KEFLEX) 500 MG capsule Take 1 capsule (500 mg total) by mouth 4 (four) times daily. 07/30/23  Yes Carleen Rhue, MD  atorvastatin (LIPITOR) 40 MG tablet TAKE ONE TABLET BY MOUTH AT BEDTIME FOR CHOLESTEROL 06/17/20 06/18/21  [provider]  chlorthalidone (HYGROTON) 25 MG tablet Take 0.5 tablets by mouth daily. 10/18/20 06/18/21  [provider]  cyclobenzaprine (FLEXERIL) 10 MG tablet Take 1 tablet (10 mg total) by mouth 2 (two) times daily as needed for muscle spasms. 03/18/23   Peter Garter, PA  DULoxetine (CYMBALTA) 30 MG capsule Take by mouth. 05/06/20   [provider]  gabapentin (NEURONTIN) 300 MG capsule TAKE ONE CAPSULE BY MOUTH EVERY MORNING AND AT NOON AND TAKE TWO CAPSULES AT BEDTIME 06/17/20 06/18/21  [provider]  gabapentin (NEURONTIN) 300 MG capsule Take 300 mg by mouth 3 (three) times daily.    [provider]  ibuprofen (ADVIL) 600 MG tablet Take 1 tablet (600  mg total) by mouth every 6 (six) hours as needed. 03/18/23   Sherian Maroon A, PA  insulin glargine (LANTUS) 100 UNIT/ML injection INJECT 32 UNITS SUBCUTANEOUSLY AT BEDTIME FOR DIABETES (USE WITHIN 28 DAYS AFTER OPENING VIAL) 10/18/20 10/19/21  [provider]  insulin regular (NOVOLIN R) 100 units/mL injection Inject 16 Units into the skin 3 (three) times daily before meals.    [provider]  lidocaine (LIDODERM) 5 % Place 1 patch onto the skin daily. Remove & Discard patch within 12 hours or as directed by MD 12/12/20   Haskel Schroeder, PA-C  losartan (COZAAR) 50 MG tablet Take 25 mg by mouth daily.    [provider]  pantoprazole (PROTONIX) 40 MG tablet Take by mouth. 12/27/20   [provider]  Semaglutide,0.25 or 0.5MG /DOS, 2 MG/1.5ML SOPN Inject into the skin. 11/15/20   [provider]  triamcinolone (NASACORT) 55 MCG/ACT AERO nasal inhaler Place 2 sprays into the nose daily. 08/01/21   Rhys Martini, PA-C      Allergies    Patient has no known allergies.    Review of Systems   Review of Systems  All other systems reviewed and are negative.   Physical Exam Updated Vital Signs BP (!) 170/103 (BP Location: Right Arm)   Pulse 100   Temp 99 F (37.2 C) (Oral)   Resp (!) 24   Wt 133.8 kg   SpO2 99%   BMI 44.85 kg/m  Physical Exam Vitals  and nursing note reviewed.  Constitutional:      Appearance: He is well-developed.  HENT:     Head: Atraumatic.  Cardiovascular:     Rate and Rhythm: Normal rate.  Pulmonary:     Effort: Pulmonary effort is normal.  Musculoskeletal:     Cervical back: Neck supple.     Comments: Patient's right shin appears slightly more edematous.  Patient has about a 3 cm lesion over the anterior shin with a "HEAD/scab"  There is mild fluctuance.  Positive tenderness to palpation  Skin:    General: Skin is warm.  Neurological:     Mental Status: He is alert and oriented to person, place, and time.      ED Results / Procedures / Treatments   Labs (all labs ordered are listed, but only abnormal results are displayed) Labs Reviewed - No data to display  EKG None  Radiology No results found.  Procedures Ultrasound ED Soft Tissue  Date/Time: 07/30/2023 10:10 AM  Performed by: Derwood Kaplan, MD Authorized by: Derwood Kaplan, MD   Procedure details:    Indications: localization of abscess and evaluate for cellulitis     Transverse view:  Visualized   Longitudinal view:  Visualized   Images: archived   Location:    Location: lower extremity     Side:  Right Findings:     abscess present    cellulitis present .Marland KitchenIncision and Drainage  Date/Time: 07/30/2023 10:10 AM  Performed by: Derwood Kaplan, MD Authorized by: Derwood Kaplan, MD   Consent:    Consent obtained:  Verbal   Consent given by:  Patient   Risks, benefits, and alternatives were discussed: yes     Risks discussed:  Incomplete drainage, bleeding, infection and pain   Alternatives discussed:  Delayed treatment and observation Universal protocol:    Procedure explained and questions answered to patient or proxy's satisfaction: yes     Immediately prior to procedure, a time out was called: yes     Patient identity confirmed:  Arm band Location:    Type:  Abscess   Size:  1 cm   Location:  Lower extremity   Lower extremity location:  Leg   Leg location:  R lower leg Pre-procedure details:    Skin preparation:  Chlorhexidine with alcohol Sedation:    Sedation type:  None Anesthesia:    Anesthesia method:  Topical application and local infiltration   Topical anesthetic:  LET   Local anesthetic:  Lidocaine 1% WITH epi Procedure type:    Complexity:  Simple Procedure details:    Incision types:  Stab incision   Incision depth:  Subcutaneous   Wound management:  Probed and deloculated and irrigated with saline   Drainage:  Serosanguinous and purulent   Drainage amount:  Scant   Wound treatment:   Wound left open Post-procedure details:    Procedure completion:  Tolerated well, no immediate complications     Medications Ordered in ED Medications  bacitracin ointment (has no administration in time range)  lidocaine-EPINEPHrine-tetracaine (LET) topical gel (3 mLs Topical Given 07/30/23 0847)  lidocaine-EPINEPHrine (XYLOCAINE W/EPI) 2 %-1:200000 (PF) injection 10 mL (10 mLs Infiltration Given by Other 07/30/23 2536)    ED Course/ Medical Decision Making/ A&P                                 Medical Decision Making Risk OTC drugs. Prescription drug management.   62 year old  patient comes in with chief complaint of right leg swelling and pain.  Differential diagnosis considered for this patient includes cellulitis, cyst, abscess.  Patient has pertinent past medical history of diabetes.  The lesion appears quite tiny, but patient states that it has been bothering him now for close to 2 weeks.  We proceeded with bedside ultrasound and noted that there was about a 1 cm collection of fluid or cavity below the fluctuant area.  I gave patient the option of antibiotics with warm compresses and return to the ER if the symptoms get worse versus proceeding with I&D here.  Patient preferred the latter.  I&D completed.  There was scant purulent drainage followed by serosanguineous drainage.  Will put him on Keflex.  Ultrasound did show evidence of cellulitis as well. Return precautions discussed.  Final Clinical Impression(s) / ED Diagnoses Final diagnoses:  Cellulitis and abscess of leg    Rx / DC Orders ED Discharge Orders          Ordered    bacitracin ointment  2 times daily        07/30/23 1007    cephALEXin (KEFLEX) 500 MG capsule  4 times daily        07/30/23 1007              Derwood Kaplan, MD 07/30/23 1014

## 2023-07-30 NOTE — ED Notes (Signed)
Urine was sent to the lab. No orders at this time.

## 2023-07-30 NOTE — ED Notes (Signed)
Wound dressed, wound care discussed with pt and voiced understanding.Dc instructions reviewed with patient. Patient voiced understanding. Dc with belongings.

## 2023-09-04 ENCOUNTER — Ambulatory Visit (INDEPENDENT_AMBULATORY_CARE_PROVIDER_SITE_OTHER): Payer: Medicaid Other | Admitting: Podiatry

## 2023-09-04 ENCOUNTER — Encounter: Payer: Self-pay | Admitting: Podiatry

## 2023-09-04 DIAGNOSIS — M79674 Pain in right toe(s): Secondary | ICD-10-CM

## 2023-09-04 DIAGNOSIS — B351 Tinea unguium: Secondary | ICD-10-CM

## 2023-09-04 DIAGNOSIS — M79675 Pain in left toe(s): Secondary | ICD-10-CM

## 2023-09-04 NOTE — Progress Notes (Signed)
  Subjective:  Patient ID: Jesse Whitehead, male    DOB: [birth year],  MRN: [id number]  Chief Complaint  Patient presents with   New Patient (Initial Visit)    He is here to establish for [facility] and nail trim   62 y.o. male returns for the above complaint.  Patient presents with thickened elongated showing mycotic toenails x 10 mild pain on Botox with ambulation is with pressure patient would like me to debride down his I will do it himself denies any other acute complaints  Objective:  There were no vitals filed for this visit. Podiatric Exam: Vascular: dorsalis pedis and posterior tibial pulses are palpable bilateral. Capillary return is immediate. Temperature gradient is WNL. Skin turgor WNL  Sensorium: Normal Semmes Weinstein monofilament test. Normal tactile sensation bilaterally. Nail Exam: Pt has thick disfigured discolored nails with subungual debris noted bilateral entire nail hallux through fifth toenails.  Pain on palpation to the nails. Ulcer Exam: There is no evidence of ulcer or pre-ulcerative changes or infection. Orthopedic Exam: Muscle tone and strength are WNL. No limitations in general ROM. No crepitus or effusions noted.  Skin: No Porokeratosis. No infection or ulcers    Assessment & Plan:   1. Pain due to onychomycosis of toenails of both feet     Patient was evaluated and treated and all questions answered.  Onychomycosis with pain  -Nails palliatively debrided as below. -Educated on self-care  Procedure: Nail Debridement Rationale: pain  Type of Debridement: manual, sharp debridement. Instrumentation: Nail nipper, rotary burr. Number of Nails: 10  Procedures and Treatment: Consent by patient was obtained for treatment procedures. The patient understood the discussion of treatment and procedures well. All questions were answered thoroughly reviewed. Debridement of mycotic and hypertrophic toenails, 1 through 5 bilateral and clearing of subungual debris. No  ulceration, no infection noted.  Return Visit-Office Procedure: Patient instructed to return to the office for a follow up visit 3 months for continued evaluation and treatment.  Nicholes Rough, DPM    No follow-ups on file.

## 2023-09-25 ENCOUNTER — Emergency Department (HOSPITAL_BASED_OUTPATIENT_CLINIC_OR_DEPARTMENT_OTHER): Payer: Medicaid Other | Admitting: Radiology

## 2023-09-25 ENCOUNTER — Emergency Department (HOSPITAL_BASED_OUTPATIENT_CLINIC_OR_DEPARTMENT_OTHER): Payer: Medicaid Other

## 2023-09-25 ENCOUNTER — Emergency Department (HOSPITAL_BASED_OUTPATIENT_CLINIC_OR_DEPARTMENT_OTHER)
Admission: EM | Admit: 2023-09-25 | Discharge: 2023-09-25 | Discharge status: Against Medical Advice | Payer: Medicaid Other | Attending: Emergency Medicine | Admitting: Emergency Medicine

## 2023-09-25 ENCOUNTER — Other Ambulatory Visit: Payer: Self-pay

## 2023-09-25 DIAGNOSIS — Z794 Long term (current) use of insulin: Secondary | ICD-10-CM | POA: Insufficient documentation

## 2023-09-25 DIAGNOSIS — Z79899 Other long term (current) drug therapy: Secondary | ICD-10-CM | POA: Insufficient documentation

## 2023-09-25 DIAGNOSIS — I10 Essential (primary) hypertension: Secondary | ICD-10-CM | POA: Insufficient documentation

## 2023-09-25 DIAGNOSIS — S46912A Strain of unspecified muscle, fascia and tendon at shoulder and upper arm level, left arm, initial encounter: Secondary | ICD-10-CM | POA: Insufficient documentation

## 2023-09-25 DIAGNOSIS — E1165 Type 2 diabetes mellitus with hyperglycemia: Secondary | ICD-10-CM | POA: Diagnosis not present

## 2023-09-25 DIAGNOSIS — S0091XA Abrasion of unspecified part of head, initial encounter: Secondary | ICD-10-CM | POA: Diagnosis not present

## 2023-09-25 DIAGNOSIS — R937 Abnormal findings on diagnostic imaging of other parts of musculoskeletal system: Secondary | ICD-10-CM

## 2023-09-25 DIAGNOSIS — R519 Headache, unspecified: Secondary | ICD-10-CM | POA: Diagnosis not present

## 2023-09-25 DIAGNOSIS — R739 Hyperglycemia, unspecified: Secondary | ICD-10-CM

## 2023-09-25 DIAGNOSIS — W109XXA Fall (on) (from) unspecified stairs and steps, initial encounter: Secondary | ICD-10-CM | POA: Diagnosis not present

## 2023-09-25 DIAGNOSIS — R9389 Abnormal findings on diagnostic imaging of other specified body structures: Secondary | ICD-10-CM | POA: Insufficient documentation

## 2023-09-25 DIAGNOSIS — M25512 Pain in left shoulder: Secondary | ICD-10-CM | POA: Diagnosis present

## 2023-09-25 LAB — CBC WITH DIFFERENTIAL/PLATELET
Abs Immature Granulocytes: 0.05 10*3/uL (ref 0.00–0.07)
Basophils Absolute: 0 10*3/uL (ref 0.0–0.1)
Basophils Relative: 1 %
Eosinophils Absolute: 0.1 10*3/uL (ref 0.0–0.5)
Eosinophils Relative: 1 %
HCT: 43.4 % (ref 39.0–52.0)
Hemoglobin: 14.4 g/dL (ref 13.0–17.0)
Immature Granulocytes: 1 %
Lymphocytes Relative: 14 %
Lymphs Abs: 1.2 10*3/uL (ref 0.7–4.0)
MCH: 31.2 pg (ref 26.0–34.0)
MCHC: 33.2 g/dL (ref 30.0–36.0)
MCV: 93.9 fL (ref 80.0–100.0)
Monocytes Absolute: 1 10*3/uL (ref 0.1–1.0)
Monocytes Relative: 11 %
Neutro Abs: 6.2 10*3/uL (ref 1.7–7.7)
Neutrophils Relative %: 72 %
Platelets: 191 10*3/uL (ref 150–400)
RBC: 4.62 MIL/uL (ref 4.22–5.81)
RDW: 12.9 % (ref 11.5–15.5)
WBC: 8.5 10*3/uL (ref 4.0–10.5)
nRBC: 0 % (ref 0.0–0.2)

## 2023-09-25 LAB — BASIC METABOLIC PANEL
Anion gap: 13 (ref 5–15)
BUN: 10 mg/dL (ref 8–23)
CO2: 24 mmol/L (ref 22–32)
Calcium: 9.1 mg/dL (ref 8.9–10.3)
Chloride: 93 mmol/L — ABNORMAL LOW (ref 98–111)
Creatinine, Ser: 1.03 mg/dL (ref 0.61–1.24)
GFR, Estimated: >60 mL/min (ref >60)
Glucose, Bld: 715 mg/dL (ref 70–99)
Potassium: 3.6 mmol/L (ref 3.5–5.1)
Sodium: 130 mmol/L — ABNORMAL LOW (ref 135–145)

## 2023-09-25 LAB — SEDIMENTATION RATE: Sed Rate: 18 mm/h — ABNORMAL HIGH (ref 0–16)

## 2023-09-25 LAB — C-REACTIVE PROTEIN: CRP: 3.1 mg/dL — ABNORMAL HIGH (ref <1.0)

## 2023-09-25 LAB — CBG MONITORING, ED: Glucose-Capillary: 360 mg/dL — ABNORMAL HIGH (ref 70–99)

## 2023-09-25 MED ORDER — IOHEXOL 300 MG/ML  SOLN
100.0000 mL | Freq: Once | INTRAMUSCULAR | Status: AC | PRN
  Administered 2023-09-25: 100 mL via INTRAVENOUS

## 2023-09-25 MED ORDER — INSULIN ASPART 100 UNIT/ML IJ SOLN
5.0000 [IU] | Freq: Once | INTRAMUSCULAR | Status: AC
  Administered 2023-09-25: 5 [IU] via SUBCUTANEOUS

## 2023-09-25 MED ORDER — FENTANYL CITRATE PF 50 MCG/ML IJ SOSY
50.0000 ug | PREFILLED_SYRINGE | Freq: Once | INTRAMUSCULAR | Status: AC
  Administered 2023-09-25: 50 ug via INTRAVENOUS
  Filled 2023-09-25: qty 1

## 2023-09-25 MED ORDER — SODIUM CHLORIDE 0.9 % IV BOLUS
1000.0000 mL | Freq: Once | INTRAVENOUS | Status: AC
  Administered 2023-09-25: 1000 mL via INTRAVENOUS

## 2023-09-25 NOTE — Discharge Instructions (Addendum)
 Your glucose is markedly elevated.  You need to make sure that you are taking your medications and watching your diet to try to get this under control.  You have some inflammation of your cervical spine.  This could represent an infection in the bone.  This could be life-threatening.  You need to have close follow-up with your primary care doctor regarding this.  Return to the emergency room if you have any worsening symptoms or change your mind about getting treatment.

## 2023-09-25 NOTE — ED Notes (Addendum)
 Pt was alert and oriented x4... Pt explained and understood that he was leaving AMA... Pt understood that he was still hyperglycemic (360 and provider was aware) and that the provider was unsure about the CT scans... Pt stated that he was fine, going to go home and take his meds for his glycemia... Pt was informed he could come back whenever he felt he needed help, Pt understood... Pt was able to ambulate out of the hospital without assistance... Pt was also aware that he was not aloud to drive due to the meds that were given, he understood and stated that his GF would drive... Everything was witnessed by [employer][email or web address]

## 2023-09-25 NOTE — ED Triage Notes (Signed)
 Pt caox4 reporting he fell from the top to the bottom of 11 stairs when his knees buckled at the top of the stairs 2 days ago. Pt c/o L shoulder pain, L lower back pain radiating down left buttocks. Pt reports he did hit his head but denies LOC. Abrasion to forehead. Pt does not take blood thinner medication. Pt denies weakness/numbness/tingling in all extremities. Pt denies SOB, N/V, dizziness.

## 2023-09-25 NOTE — ED Provider Notes (Signed)
 [place] [facility] Provider Note   CSN: [id number] Arrival date & time: 09/25/23  9091     History  Chief Complaint  Patient presents with   Lenell Lama Melkonian is a 63 y.o. male.  Patient is a 63 year old male with a history of diabetes, hypertension and sleep apnea and obesity who presents after a fall.  He states that 2 days ago he fell down 11 stairs.  He was at the top of the stairs and lost his balance, falling down the stairs.  There was no loss of consciousness.  He does have an abrasion to his head.  He has some pain in his neck and mostly in his left shoulder and left upper back.  He denies any numbness or weakness to his extremities.  No abdominal pain.  No vomiting.  No shortness of breath.  He is not on anticoagulants.  He denies any other injuries from the fall.       Home Medications Prior to Admission medications   Medication Sig Start Date End Date Taking? Authorizing Provider  bacitracin  ointment Apply 1 Application topically 2 (two) times daily. 07/30/23   Charlyn Sora, MD  chlorthalidone (HYGROTON) 25 MG tablet Take 0.5 tablets by mouth daily. 10/18/20 06/18/21  [provider]  cyclobenzaprine  (FLEXERIL ) 10 MG tablet Take 1 tablet (10 mg total) by mouth 2 (two) times daily as needed for muscle spasms. 03/18/23   Silver Wonda LABOR, PA  DULoxetine (CYMBALTA) 30 MG capsule Take by mouth. 05/06/20   [provider]  gabapentin (NEURONTIN) 300 MG capsule TAKE ONE CAPSULE BY MOUTH EVERY MORNING AND AT NOON AND TAKE TWO CAPSULES AT BEDTIME 06/17/20 06/18/21  [provider]  gabapentin (NEURONTIN) 300 MG capsule Take 300 mg by mouth 3 (three) times daily.    [provider]  ibuprofen  (ADVIL ) 600 MG tablet Take 1 tablet (600 mg total) by mouth every 6 (six) hours as needed. 03/18/23   Silver Wonda LABOR, PA  insulin  glargine (LANTUS) 100 UNIT/ML injection INJECT 32 UNITS SUBCUTANEOUSLY AT BEDTIME FOR  DIABETES (USE WITHIN 28 DAYS AFTER OPENING VIAL) 10/18/20 10/19/21  [provider]  insulin  regular (NOVOLIN R) 100 units/mL injection Inject 16 Units into the skin 3 (three) times daily before meals.    [provider]  lidocaine  (LIDODERM ) 5 % Place 1 patch onto the skin daily. Remove & Discard patch within 12 hours or as directed by MD 12/12/20   Eudelia Maude SAUNDERS, PA-C  losartan (COZAAR) 50 MG tablet Take 25 mg by mouth daily.    [provider]  pantoprazole (PROTONIX) 40 MG tablet Take by mouth. 12/27/20   [provider]  Semaglutide,0.25 or 0.5MG /DOS, 2 MG/1.5ML SOPN Inject into the skin. 11/15/20   [provider]  triamcinolone  (NASACORT ) 55 MCG/ACT AERO nasal inhaler Place 2 sprays into the nose daily. 08/01/21   Graham, Laura E, PA-C      Allergies    Patient has no known allergies.    Review of Systems   Review of Systems  Constitutional:  Negative for activity change, appetite change and fever.  HENT:  Negative for dental problem, nosebleeds and trouble swallowing.   Eyes:  Negative for pain and visual disturbance.  Respiratory:  Negative for shortness of breath.   Cardiovascular:  Positive for chest pain (Left posterior rib pain).  Gastrointestinal:  Negative for abdominal pain, nausea and vomiting.  Genitourinary:  Negative for dysuria and hematuria.  Musculoskeletal:  Positive for arthralgias and back pain. Negative for joint swelling and neck pain.  Skin:  Negative for wound.  Neurological:  Positive for headaches. Negative for weakness and numbness.  Psychiatric/Behavioral:  Negative for confusion.     Physical Exam Updated Vital Signs BP (!) 143/93   Pulse (!) 102   Temp 98.7 F (37.1 C) (Oral)   Resp 20   Ht 5' 8 (1.727 m)   Wt 131.8 kg   SpO2 97%   BMI 44.17 kg/m  Physical Exam Vitals reviewed.  Constitutional:      Appearance: He is well-developed.  HENT:     Head: Normocephalic.     Comments: Abrasion to  right forehead    Nose: Nose normal.  Eyes:     Conjunctiva/sclera: Conjunctivae normal.     Pupils: Pupils are equal, round, and reactive to light.  Neck:     Comments: No pain to the cervical, thoracic, or LS spine.  No step-offs or deformities noted Cardiovascular:     Rate and Rhythm: Normal rate and regular rhythm.     Heart sounds: No murmur heard.    Comments: No evidence of external trauma to the chest or abdomen Pulmonary:     Effort: Pulmonary effort is normal. No respiratory distress.     Breath sounds: Normal breath sounds. No wheezing.  Chest:     Chest wall: Tenderness (Positive tenderness to the posterior left ribs, no crepitus or deformity, no visible external trauma to the chest or abdomen) present.  Abdominal:     General: Bowel sounds are normal. There is no distension.     Palpations: Abdomen is soft.     Tenderness: There is no abdominal tenderness.  Musculoskeletal:        General: Normal range of motion.     Comments: Positive tenderness on palpation and range of motion of the left shoulder, no pain to the elbow or wrist.  Radial pulse intact.  Skin:    General: Skin is warm and dry.     Capillary Refill: Capillary refill takes less than 2 seconds.  Neurological:     General: No focal deficit present.     Mental Status: He is alert and oriented to person, place, and time.     ED Results / Procedures / Treatments   Labs (all labs ordered are listed, but only abnormal results are displayed) Labs Reviewed  BASIC METABOLIC PANEL - Abnormal; Notable for the following components:      Result Value   Sodium 130 (*)    Chloride 93 (*)    Glucose, Bld 715 (*)    All other components within normal limits  CBG MONITORING, ED - Abnormal; Notable for the following components:   Glucose-Capillary 360 (*)    All other components within normal limits  CBC WITH DIFFERENTIAL/PLATELET  SEDIMENTATION RATE  C-REACTIVE PROTEIN    EKG EKG  Interpretation Date/Time:  Wednesday September 25 2023 09:35:09 EST Ventricular Rate:  100 PR Interval:  157 QRS Duration:  105 QT Interval:  362 QTC Calculation: 467 R Axis:   30  Text Interpretation: Sinus tachycardia Probable left atrial enlargement Nonspecific T abnormalities, lateral leads Confirmed by Lenor Hollering [phone]) on 09/25/2023 9:37:35 AM  Radiology CT Head Wo Contrast Addendum Date: 09/25/2023 ADDENDUM REPORT: 09/25/2023 11:35 ADDENDUM: If there is a low clinical concern for infection, a follow up CT of the cervical spine in 1-2 months is recommended to ensure stability. Electronically Signed   By:  Hemant  Desai M.D.   On: 09/25/2023 11:35   Result Date: 09/25/2023 CLINICAL DATA:  Trauma EXAM: CT HEAD WITHOUT CONTRAST CT CERVICAL SPINE WITHOUT CONTRAST TECHNIQUE: Multidetector CT imaging of the head and cervical spine was performed following the standard protocol without intravenous contrast. Multiplanar CT image reconstructions of the cervical spine were also generated. RADIATION DOSE REDUCTION: This exam was performed according to the departmental dose-optimization program which includes automated exposure control, adjustment of the mA and/or kV according to patient size and/or use of iterative reconstruction technique. COMPARISON:  CT C Spine 07/14/22 FINDINGS: CT HEAD FINDINGS Brain: No evidence of acute infarction, hemorrhage, hydrocephalus, extra-axial collection or mass lesion/mass effect. Vascular: No hyperdense vessel or unexpected calcification. Skull: Normal. Negative for fracture or focal lesion. Sinuses/Orbits: No middle ear or mastoid effusion.Polypoid mucosal thickening bilateral maxillary sinuses. Orbits are unremarkable. Other: None. CT CERVICAL SPINE FINDINGS Alignment: Trace anterolisthesis of C7 on T1. Skull base and vertebrae: No acute fracture. Compared to prior exam there is a new air filled lucent lesion in the right transverse process at T1 (series 7, image 73).  Soft tissues and spinal canal: No prevertebral fluid or swelling. No visible canal hematoma. Disc levels: No CT evidence of high grade spinal canal stenosis. Moderate to severe bilateral facet degenerative change at C7-T1. Upper chest: Negative. Other: Negative IMPRESSION: 1. No acute intracranial abnormality. 2. No acute fracture or traumatic subluxation of the cervical spine. 3. Compared to prior exam there is a new air filled lucent lesion in the right transverse process at T1. This is nonspecific and could degenerative, but correlation with ESR and CRP is recommended to exclude the possibility of infection. If patient's inflammatory markers are elevated, further evaluation with a contrast enhanced cervical spine MRI is recommended. Electronically Signed: By: Lyndall Gore M.D. On: 09/25/2023 10:55   CT Cervical Spine Wo Contrast Addendum Date: 09/25/2023 ADDENDUM REPORT: 09/25/2023 11:35 ADDENDUM: If there is a low clinical concern for infection, a follow up CT of the cervical spine in 1-2 months is recommended to ensure stability. Electronically Signed   By: Lyndall Gore M.D.   On: 09/25/2023 11:35   Result Date: 09/25/2023 CLINICAL DATA:  Trauma EXAM: CT HEAD WITHOUT CONTRAST CT CERVICAL SPINE WITHOUT CONTRAST TECHNIQUE: Multidetector CT imaging of the head and cervical spine was performed following the standard protocol without intravenous contrast. Multiplanar CT image reconstructions of the cervical spine were also generated. RADIATION DOSE REDUCTION: This exam was performed according to the departmental dose-optimization program which includes automated exposure control, adjustment of the mA and/or kV according to patient size and/or use of iterative reconstruction technique. COMPARISON:  CT C Spine 07/14/22 FINDINGS: CT HEAD FINDINGS Brain: No evidence of acute infarction, hemorrhage, hydrocephalus, extra-axial collection or mass lesion/mass effect. Vascular: No hyperdense vessel or unexpected  calcification. Skull: Normal. Negative for fracture or focal lesion. Sinuses/Orbits: No middle ear or mastoid effusion.Polypoid mucosal thickening bilateral maxillary sinuses. Orbits are unremarkable. Other: None. CT CERVICAL SPINE FINDINGS Alignment: Trace anterolisthesis of C7 on T1. Skull base and vertebrae: No acute fracture. Compared to prior exam there is a new air filled lucent lesion in the right transverse process at T1 (series 7, image 73). Soft tissues and spinal canal: No prevertebral fluid or swelling. No visible canal hematoma. Disc levels: No CT evidence of high grade spinal canal stenosis. Moderate to severe bilateral facet degenerative change at C7-T1. Upper chest: Negative. Other: Negative IMPRESSION: 1. No acute intracranial abnormality. 2. No acute fracture or traumatic subluxation  of the cervical spine. 3. Compared to prior exam there is a new air filled lucent lesion in the right transverse process at T1. This is nonspecific and could degenerative, but correlation with ESR and CRP is recommended to exclude the possibility of infection. If patient's inflammatory markers are elevated, further evaluation with a contrast enhanced cervical spine MRI is recommended. Electronically Signed: By: Lyndall Gore M.D. On: 09/25/2023 10:55   CT CHEST ABDOMEN PELVIS W CONTRAST Result Date: 09/25/2023 CLINICAL DATA:  Poly trauma.  Fall and left posterior back pain. EXAM: CT CHEST, ABDOMEN, AND PELVIS WITH CONTRAST TECHNIQUE: Multidetector CT imaging of the chest, abdomen and pelvis was performed following the standard protocol during bolus administration of intravenous contrast. RADIATION DOSE REDUCTION: This exam was performed according to the departmental dose-optimization program which includes automated exposure control, adjustment of the mA and/or kV according to patient size and/or use of iterative reconstruction technique. CONTRAST:  OMNIPAQUE  IOHEXOL  300 MG/ML  SOLN COMPARISON:  CT of the chest  abdomen pelvis dated 03/18/2023. FINDINGS: CT CHEST FINDINGS Cardiovascular: There is no cardiomegaly or pericardial effusion. Mild atherosclerotic calcification of the aortic arch. No aneurysmal dilatation or dissection. The origins of the great vessels of the aortic arch and the central pulmonary arteries appear patent. Mediastinum/Nodes: No hilar or mediastinal adenopathy. The esophagus and the thyroid gland are grossly unremarkable. No mediastinal fluid collection. Lungs/Pleura: No focal consolidation, pleural effusion, or pneumothorax. The central airways are patent. Musculoskeletal: No acute osseous pathology. Degenerative changes spine. CT ABDOMEN PELVIS FINDINGS No intra-abdominal free air or free fluid. Hepatobiliary: Fatty liver. No biliary dilatation. Cholecystectomy. Pancreas: Unremarkable. No pancreatic ductal dilatation or surrounding inflammatory changes. Spleen: Normal in size without focal abnormality. Adrenals/Urinary Tract: The adrenal glands unremarkable. Several small nonobstructing left renal calculi measure up to 3 mm. A punctate nonobstructing stone noted in the interpolar right kidney. There is no hydronephrosis on either side. There is symmetric enhancement and excretion of contrast by both kidneys. Small right renal interpolar cyst. The visualized ureters and urinary bladder appear unremarkable. Stomach/Bowel: There is no bowel obstruction or active inflammation. The appendix is normal. Vascular/Lymphatic: Mild aortoiliac atherosclerotic disease. The IVC is unremarkable. No portal venous gas. There is no adenopathy. Reproductive: The prostate and seminal vesicles are grossly unremarkable. No pelvic mass. Other: None Musculoskeletal: Degenerative changes of the spine. No acute osseous pathology. IMPRESSION: 1. No acute/traumatic intrathoracic, abdominal, or pelvic pathology. 2. Fatty liver. 3. Small nonobstructing bilateral renal calculi. No hydronephrosis. 4.  Aortic Atherosclerosis  (ICD10-I70.0). Electronically Signed   By: Vanetta Chou M.D.   On: 09/25/2023 11:11   DG Shoulder Left Result Date: 09/25/2023 CLINICAL DATA:  63 year old male status post fall down stairs. Pain. EXAM: LEFT SHOULDER - 2+ VIEW COMPARISON:  Chest CT today reported separately. Cervical spine CT 07/14/2022. FINDINGS: Bone mineralization is within normal limits for age. Proximal left humerus appears intact. No evidence of glenohumeral dislocation. Visible left scapula and clavicle appear intact. Visible left ribs appear intact. IMPRESSION: No acute fracture or dislocation identified about the left shoulder. Electronically Signed   By: VEAR Hurst M.D.   On: 09/25/2023 11:05    Procedures Procedures    Medications Ordered in ED Medications  fentaNYL  (SUBLIMAZE ) injection 50 mcg (50 mcg Intravenous Given 09/25/23 0945)  iohexol  (OMNIPAQUE ) 300 MG/ML solution 100 mL (100 mLs Intravenous Contrast Given 09/25/23 1022)  sodium chloride  0.9 % bolus 1,000 mL (0 mLs Intravenous Stopped 09/25/23 1224)  insulin  aspart (novoLOG ) injection 5 Units (5  Units Subcutaneous Given 09/25/23 1130)    ED Course/ Medical Decision Making/ A&P                                 Medical Decision Making Amount and/or Complexity of Data Reviewed Labs: ordered. Radiology: ordered.  Risk Prescription drug management.   Patient is a 63 year old who presents with primarily shoulder and back pain after a fall.  Given that he fell down 11 stairs, trauma scans were performed.  He has CT head and cervical spine.  No intracranial hemorrhage was noted.  There was a questionable abnormality of the T1 transverse process.  I discussed these findings with the radiologist.  They feel like it represented a little bit more than typical degenerative changes and could not completely exclude out infectious etiology.  Will check a sed rate and CRP.  CT chest abdomen pelvis did not show any acute abnormality.  No traumatic injuries.  X-rays of his  left shoulder were interpreted by me and confirmed by the radiologist to show no fracture.  No dislocation.  On his labs, his glucose was noted to be markedly elevated over 700.  I asked him about this and he says he has not taken his medications in the last 2 days and he has been eating some donuts and Turtle candy and sweet tea.  He was given 1 L of normal saline as well as 5 units of subcu insulin .  He does not have any suggestions of DKA.  He has some pseudohyponatremia present.  His glucose came down to 360.  I discussed the findings with him and advised that we were waiting for his sed rate and CRP as well as getting his glucose improved.  At this point he got very agitated and said he wanted to leave now.  He wanted his IV out that minute and was ready to go.  I advised him that it would be better to wait and these values about I did note his glucose at home and follow CT finding.  He left AMA prior to completion of treatment/evaluation.  His significant other was at bedside.  Final Clinical Impression(s) / ED Diagnoses Final diagnoses:  Hyperglycemia  Abnormal CT scan, cervical spine  Shoulder strain, left, initial encounter    Rx / DC Orders ED Discharge Orders     None         Lenor Hollering, MD 09/25/23 1233

## 2023-11-08 ENCOUNTER — Ambulatory Visit: Payer: Medicaid Other | Admitting: Student

## 2024-02-26 ENCOUNTER — Ambulatory Visit (INDEPENDENT_AMBULATORY_CARE_PROVIDER_SITE_OTHER): Admitting: Podiatry

## 2024-02-26 ENCOUNTER — Encounter: Payer: Self-pay | Admitting: Podiatry

## 2024-02-26 DIAGNOSIS — E1142 Type 2 diabetes mellitus with diabetic polyneuropathy: Secondary | ICD-10-CM

## 2024-02-26 DIAGNOSIS — M79675 Pain in left toe(s): Secondary | ICD-10-CM | POA: Diagnosis not present

## 2024-02-26 DIAGNOSIS — B351 Tinea unguium: Secondary | ICD-10-CM

## 2024-02-26 DIAGNOSIS — M79674 Pain in right toe(s): Secondary | ICD-10-CM | POA: Diagnosis not present

## 2024-02-26 NOTE — Progress Notes (Signed)
This patient returns to my office for at risk foot care.  This patient requires this care by a professional since this patient will be at risk due to having diabetic neuropathy.  This patient is unable to cut nails himself since the patient cannot reach his nails.These nails are painful walking and wearing shoes.  This patient presents for at risk foot care today.  General Appearance  Alert, conversant and in no acute stress.  Vascular  Dorsalis pedis and posterior tibial  pulses are palpable  bilaterally.  Capillary return is within normal limits  bilaterally. Temperature is within normal limits  bilaterally.  Neurologic  Senn-Weinstein monofilament wire test diminished   bilaterally. Muscle power within normal limits bilaterally.  Nails Thick disfigured discolored nails with subungual debris  from hallux to fifth toes bilaterally. No evidence of bacterial infection or drainage bilaterally.  Orthopedic  No limitations of motion  feet .  No crepitus or effusions noted.  No bony pathology or digital deformities noted.  Skin  normotropic skin with no porokeratosis noted bilaterally.  No signs of infections or ulcers noted.     Onychomycosis  Pain in right toes  Pain in left toes  Consent was obtained for treatment procedures.   Mechanical debridement of nails 1-5  bilaterally performed with a nail nipper.  Filed with dremel without incident.    Return office visit     3 months                 Told patient to return for periodic foot care and evaluation due to potential at risk complications.   Gardiner Barefoot DPM

## 2024-06-25 ENCOUNTER — Other Ambulatory Visit: Payer: Self-pay

## 2024-06-25 ENCOUNTER — Emergency Department (HOSPITAL_BASED_OUTPATIENT_CLINIC_OR_DEPARTMENT_OTHER)

## 2024-06-25 ENCOUNTER — Other Ambulatory Visit (HOSPITAL_BASED_OUTPATIENT_CLINIC_OR_DEPARTMENT_OTHER): Payer: Self-pay

## 2024-06-25 ENCOUNTER — Emergency Department (HOSPITAL_BASED_OUTPATIENT_CLINIC_OR_DEPARTMENT_OTHER)
Admission: EM | Admit: 2024-06-25 | Discharge: 2024-06-25 | Disposition: A | Discharge status: Home / Self Care | Attending: Emergency Medicine | Admitting: Emergency Medicine

## 2024-06-25 DIAGNOSIS — J45909 Unspecified asthma, uncomplicated: Secondary | ICD-10-CM | POA: Diagnosis not present

## 2024-06-25 DIAGNOSIS — F17200 Nicotine dependence, unspecified, uncomplicated: Secondary | ICD-10-CM | POA: Diagnosis not present

## 2024-06-25 DIAGNOSIS — Z794 Long term (current) use of insulin: Secondary | ICD-10-CM | POA: Insufficient documentation

## 2024-06-25 DIAGNOSIS — J189 Pneumonia, unspecified organism: Secondary | ICD-10-CM | POA: Insufficient documentation

## 2024-06-25 DIAGNOSIS — E1165 Type 2 diabetes mellitus with hyperglycemia: Secondary | ICD-10-CM | POA: Diagnosis not present

## 2024-06-25 DIAGNOSIS — R739 Hyperglycemia, unspecified: Secondary | ICD-10-CM

## 2024-06-25 DIAGNOSIS — R059 Cough, unspecified: Secondary | ICD-10-CM | POA: Diagnosis present

## 2024-06-25 LAB — CBC WITH DIFFERENTIAL/PLATELET
Abs Immature Granulocytes: 0.02 K/uL (ref 0.00–0.07)
Basophils Absolute: 0 K/uL (ref 0.0–0.1)
Basophils Relative: 0 %
Eosinophils Absolute: 0.1 K/uL (ref 0.0–0.5)
Eosinophils Relative: 1 %
HCT: 42.8 % (ref 39.0–52.0)
Hemoglobin: 14 g/dL (ref 13.0–17.0)
Immature Granulocytes: 0 %
Lymphocytes Relative: 15 %
Lymphs Abs: 1 K/uL (ref 0.7–4.0)
MCH: 30.9 pg (ref 26.0–34.0)
MCHC: 32.7 g/dL (ref 30.0–36.0)
MCV: 94.5 fL (ref 80.0–100.0)
Monocytes Absolute: 0.9 K/uL (ref 0.1–1.0)
Monocytes Relative: 13 %
Neutro Abs: 5.1 K/uL (ref 1.7–7.7)
Neutrophils Relative %: 71 %
Platelets: 166 K/uL (ref 150–400)
RBC: 4.53 MIL/uL (ref 4.22–5.81)
RDW: 12.5 % (ref 11.5–15.5)
WBC: 7.1 K/uL (ref 4.0–10.5)
nRBC: 0 % (ref 0.0–0.2)

## 2024-06-25 LAB — BASIC METABOLIC PANEL WITH GFR
Anion gap: 14 (ref 5–15)
BUN: 7 mg/dL — ABNORMAL LOW (ref 8–23)
CO2: 24 mmol/L (ref 22–32)
Calcium: 9.5 mg/dL (ref 8.9–10.3)
Chloride: 96 mmol/L — ABNORMAL LOW (ref 98–111)
Creatinine, Ser: 0.94 mg/dL (ref 0.61–1.24)
GFR, Estimated: >60 mL/min (ref >60)
Glucose, Bld: 530 mg/dL (ref 70–99)
Potassium: 3.8 mmol/L (ref 3.5–5.1)
Sodium: 134 mmol/L — ABNORMAL LOW (ref 135–145)

## 2024-06-25 LAB — RESP PANEL BY RT-PCR (RSV, FLU A&B, COVID)  RVPGX2
Influenza A by PCR: NEGATIVE
Influenza B by PCR: NEGATIVE
Resp Syncytial Virus by PCR: NEGATIVE
SARS Coronavirus 2 by RT PCR: NEGATIVE

## 2024-06-25 LAB — CBG MONITORING, ED: Glucose-Capillary: 296 mg/dL — ABNORMAL HIGH (ref 70–99)

## 2024-06-25 LAB — TROPONIN T, HIGH SENSITIVITY: Troponin T High Sensitivity: <15 ng/L (ref 0–19)

## 2024-06-25 MED ORDER — ALBUTEROL SULFATE HFA 108 (90 BASE) MCG/ACT IN AERS
2.0000 | INHALATION_SPRAY | Freq: Once | RESPIRATORY_TRACT | Status: AC
  Administered 2024-06-25: 2 via RESPIRATORY_TRACT
  Filled 2024-06-25: qty 6.7

## 2024-06-25 MED ORDER — IOHEXOL 350 MG/ML SOLN
100.0000 mL | Freq: Once | INTRAVENOUS | Status: AC | PRN
  Administered 2024-06-25: 100 mL via INTRAVENOUS

## 2024-06-25 MED ORDER — DOXYCYCLINE HYCLATE 100 MG PO CAPS
100.0000 mg | ORAL_CAPSULE | Freq: Two times a day (BID) | ORAL | 0 refills | Status: DC
  Filled 2024-06-25: qty 20, 10d supply, fill #0

## 2024-06-25 MED ORDER — INSULIN ASPART 100 UNIT/ML IJ SOLN
10.0000 [IU] | Freq: Once | INTRAMUSCULAR | Status: AC
  Administered 2024-06-25: 10 [IU] via SUBCUTANEOUS

## 2024-06-25 MED ORDER — IPRATROPIUM-ALBUTEROL 0.5-2.5 (3) MG/3ML IN SOLN
RESPIRATORY_TRACT | Status: AC
  Administered 2024-06-25: 3 mL
  Filled 2024-06-25: qty 3

## 2024-06-25 MED ORDER — SODIUM CHLORIDE 0.9 % IV BOLUS
1000.0000 mL | Freq: Once | INTRAVENOUS | Status: AC
  Administered 2024-06-25: 1000 mL via INTRAVENOUS

## 2024-06-25 MED ORDER — DOXYCYCLINE HYCLATE 100 MG PO TABS
100.0000 mg | ORAL_TABLET | Freq: Once | ORAL | Status: AC
  Administered 2024-06-25: 100 mg via ORAL
  Filled 2024-06-25: qty 1

## 2024-06-25 NOTE — ED Notes (Signed)
 DC paperwork given and verbally understood.

## 2024-06-25 NOTE — ED Notes (Addendum)
 Dr. Ruthe at bedside during triage. Declined Sepsis protocol orders at this time.

## 2024-06-25 NOTE — ED Notes (Addendum)
 Dr. Ruthe notified of critical result. Glucose 530

## 2024-06-25 NOTE — ED Provider Notes (Signed)
 Jesse Whitehead Provider Note   CSN: [id number] Arrival date & time: 06/25/24  0700     Patient presents with: Shortness of Breath and Cough   Jesse Whitehead is a 63 y.o. male.   Patient here with cough congestion shortness of breath for the last several days.  Around sick contacts.  History of diabetes.  Smokes sometimes.  History of asthma as a child.  Denies any weakness numbness tingling.  He had diarrhea the first day when he started to feel bad.  He he has had maybe some fevers at home.  He denies any abdominal pain nausea or vomiting.  The history is provided by the patient.       Prior to Admission medications   Medication Sig Start Date End Date Taking? Authorizing Provider  doxycycline (VIBRAMYCIN) 100 MG capsule Take 1 capsule (100 mg total) by mouth 2 (two) times daily. 06/25/24  Yes Joshwa Hemric, DO  bacitracin  ointment Apply 1 Application topically 2 (two) times daily. 07/30/23   Charlyn Sora, MD  chlorthalidone (HYGROTON) 25 MG tablet Take 0.5 tablets by mouth daily. 10/18/20 06/18/21  [provider]  cyclobenzaprine  (FLEXERIL ) 10 MG tablet Take 1 tablet (10 mg total) by mouth 2 (two) times daily as needed for muscle spasms. 03/18/23   Silver Wonda LABOR, PA  DULoxetine (CYMBALTA) 30 MG capsule Take by mouth. 05/06/20   [provider]  gabapentin (NEURONTIN) 300 MG capsule TAKE ONE CAPSULE BY MOUTH EVERY MORNING AND AT NOON AND TAKE TWO CAPSULES AT BEDTIME 06/17/20 06/18/21  [provider]  gabapentin (NEURONTIN) 300 MG capsule Take 300 mg by mouth 3 (three) times daily.    [provider]  ibuprofen  (ADVIL ) 600 MG tablet Take 1 tablet (600 mg total) by mouth every 6 (six) hours as needed. 03/18/23   Silver Wonda A, PA  insulin  glargine (LANTUS) 100 UNIT/ML injection INJECT 32 UNITS SUBCUTANEOUSLY AT BEDTIME FOR DIABETES (USE WITHIN 28 DAYS AFTER OPENING VIAL) 10/18/20 10/19/21  [provider]  insulin  regular (NOVOLIN R) 100 units/mL injection Inject 16 Units into the skin 3 (three) times daily before meals.    [provider]  lidocaine  (LIDODERM ) 5 % Place 1 patch onto the skin daily. Remove & Discard patch within 12 hours or as directed by MD 12/12/20   Eudelia Maude SAUNDERS, PA-C  losartan (COZAAR) 50 MG tablet Take 25 mg by mouth daily.    [provider]  pantoprazole (PROTONIX) 40 MG tablet Take by mouth. 12/27/20   [provider]  Semaglutide,0.25 or 0.5MG /DOS, 2 MG/1.5ML SOPN Inject into the skin. 11/15/20   [provider]  triamcinolone  (NASACORT ) 55 MCG/ACT AERO nasal inhaler Place 2 sprays into the nose daily. 08/01/21   Graham, Laura E, PA-C    Allergies: Patient has no known allergies.    Review of Systems  Updated Vital Signs BP (!) 133/98 (BP Location: Right Arm)   Pulse (!) 102   Temp 98.6 F (37 C) (Oral)   Resp 20   Ht 5' 8 (1.727 m)   Wt 117.9 kg   SpO2 95%   BMI 39.53 kg/m   Physical Exam Vitals and nursing note reviewed.  Constitutional:      General: He is not in acute distress.    Appearance: He is well-developed.  HENT:     Head: Normocephalic and atraumatic.  Eyes:     Conjunctiva/sclera: Conjunctivae normal.  Cardiovascular:     Rate and  Rhythm: Normal rate and regular rhythm.     Heart sounds: No murmur heard. Pulmonary:     Effort: Pulmonary effort is normal. No respiratory distress.     Breath sounds: Wheezing present. No decreased breath sounds or rhonchi.  Abdominal:     Palpations: Abdomen is soft.     Tenderness: There is no abdominal tenderness.  Musculoskeletal:        General: No swelling. Normal range of motion.     Cervical back: Neck supple.     Right lower leg: No edema.     Left lower leg: No edema.  Skin:    General: Skin is warm and dry.     Capillary Refill: Capillary refill takes less than 2 seconds.  Neurological:     Mental Status: He is alert.   Psychiatric:        Mood and Affect: Mood normal.     (all labs ordered are listed, but only abnormal results are displayed) Labs Reviewed  BASIC METABOLIC PANEL WITH GFR - Abnormal; Notable for the following components:      Result Value   Sodium 134 (*)    Chloride 96 (*)    Glucose, Bld 530 (*)    BUN 7 (*)    All other components within normal limits  CBG MONITORING, ED - Abnormal; Notable for the following components:   Glucose-Capillary 296 (*)    All other components within normal limits  RESP PANEL BY RT-PCR (RSV, FLU A&B, COVID)  RVPGX2  CBC WITH DIFFERENTIAL/PLATELET  TROPONIN T, HIGH SENSITIVITY    EKG: EKG Interpretation Date/Time:  Thursday June 25 2024 07:10:52 EDT Ventricular Rate:  110 PR Interval:  100 QRS Duration:  93 QT Interval:  380 QTC Calculation: 515 R Axis:   61  Text Interpretation: Sinus tachycardia Right atrial enlargement Confirmed by Ruthe Cornet [phone]) on 06/25/2024 7:11:57 AM  Radiology: CT Angio Chest PE W and/or Wo Contrast Result Date: 06/25/2024 EXAM: CTA of the Chest with contrast for PE 06/25/2024 09:02:10 AM TECHNIQUE: CTA of the chest was performed without and with the administration of 100 mL of iohexol  (OMNIPAQUE ) 350 MG/ML injection. Multiplanar reformatted images are provided for review. MIP images are provided for review. Automated exposure control, iterative reconstruction, and/or weight based adjustment of the mA/kV was utilized to reduce the radiation dose to as low as reasonably achievable. COMPARISON: CT chest 09/25/2023. CLINICAL HISTORY: Pulmonary embolism (PE) suspected, high prob. SOB and cough x3 days. Smoker. Course lung sounds bilaterally, diminished in bases. FINDINGS: PULMONARY ARTERIES: Pulmonary arteries are adequately opacified for evaluation. No pulmonary embolism. Main pulmonary artery is normal in caliber. MEDIASTINUM: The heart and pericardium demonstrate no acute abnormality. There is no acute abnormality  of the thoracic aorta. LYMPH NODES: No mediastinal, hilar or axillary lymphadenopathy. LUNGS AND PLEURA: A round focus of consolidation in the superior aspect of the right lower lobe measures 2.6 x 1.7 cm on image 51 of series 5. There is a small layering right pleural effusion present. No pulmonary edema. No pneumothorax. UPPER ABDOMEN: Limited images of the upper abdomen are unremarkable. SOFT TISSUES AND BONES: No acute bone or soft tissue abnormality. IMPRESSION: 1. No pulmonary embolism. 2. Round focus of consolidation in the superior right lower lobe measuring 2.6 x 1.7 cm. Favor a small focus of pulmonary infection. Recommended follow up CT in 4 to 6 weeks to demonstrate resolution. 3. Small layering right pleural effusion. . Electronically signed by: Norleen Boxer MD 06/25/2024 09:56 AM EDT  RP Workstation: [id number]   DG Chest 1 View Result Date: 06/25/2024 EXAM: 1 VIEW(S) XRAY OF THE CHEST 06/25/2024 07:33:20 AM COMPARISON: CT chest abdomen and pelvis 09/25/2023 CLINICAL HISTORY: SOB and cough x3 days. Smoker. Course lung sounds bilaterally, diminished in bases. FINDINGS: LUNGS AND PLEURA: No focal pulmonary opacity. No pulmonary edema. No pleural effusion. No pneumothorax. HEART AND MEDIASTINUM: No acute abnormality of the cardiac and mediastinal silhouettes. BONES AND SOFT TISSUES: No acute osseous abnormality. IMPRESSION: 1. Normal chest radiograph without acute cardiopulmonary abnormality. Electronically signed by: Waddell Calk MD 06/25/2024 07:39 AM EDT RP Workstation: [id number]     Procedures   Medications Ordered in the ED  albuterol (VENTOLIN HFA) 108 (90 Base) MCG/ACT inhaler 2 puff (has no administration in time range)  doxycycline (VIBRA-TABS) tablet 100 mg (has no administration in time range)  ipratropium-albuterol (DUONEB) 0.5-2.5 (3) MG/3ML nebulizer solution (3 mLs  Given 06/25/24 0716)  sodium chloride  0.9 % bolus 1,000 mL (0 mLs Intravenous Stopped 06/25/24 1002)  insulin   aspart (novoLOG ) injection 10 Units (10 Units Subcutaneous Given 06/25/24 0844)  iohexol  (OMNIPAQUE ) 350 MG/ML injection 100 mL (100 mLs Intravenous Contrast Given 06/25/24 0857)                                    Medical Decision Making Amount and/or Complexity of Data Reviewed Labs: ordered. Radiology: ordered.  Risk Prescription drug management.   Kalim Niess is here with cough and shortness of breath.  Unremarkable vitals except for mild tachypnea and tachycardia.  EKG shows sinus tachycardia with no ischemic changes.  Differential diagnosis likely viral process/bronchitis/pneumonia but will be conservative and evaluate for ACS as well.  Low suspicion for PE at this time.  No risk factors.  Multiple sick contacts with similar symptoms feels like this is likely viral etiology.  Does smoke.  Wheezing on exam.  Otherwise exam is unremarkable.  Will give breathing treatment and reevaluate.  Overall chest x-ray negative for pneumonia or pneumothorax per my review interpretation.  No significant leukocytosis or anemia.  Blood sugar greater than 500 but patient not in DKA.  Given IV fluid bolus IV insulin  with improvement of his blood sugar down to the 200 range.  Troponins normal.  Viral panel negative.  I pursued a CT scan of his chest to further evaluate for PE versus infection and per radiology report they do suspect may be a focus of consolidation in the lung consistent with infection.  Will put him on antibiotics.  Have no concern for sepsis.  Recommend that he follow-up with his primary care doctor for repeat CT scan.  Told to return if symptoms worsen.  Will give albuterol inhaler to use as needed.  Discharged in good condition.  Will hold off on steroids given poorly controlled diabetes.  This chart was dictated using voice recognition software.  Despite best efforts to proofread,  errors can occur which can change the documentation meaning.      Final diagnoses:  Community  acquired pneumonia, unspecified laterality    ED Discharge Orders          Ordered    doxycycline (VIBRAMYCIN) 100 MG capsule  2 times daily        06/25/24 1018               Sallie Staron, DO 06/25/24 1019

## 2024-06-25 NOTE — Discharge Instructions (Addendum)
 Take antibiotic as prescribed.  Take your next dose at dinnertime.  Use 2 to 4 puffs of albuterol inhaler every 4-6 hours as needed.  Follow-up with your primary care doctor to ensure resolution of pneumonia seen on CT scan of your chest.  May need a repeat CT scan to make sure that this goes away.  Blood sugar was elevated today as well.  Make sure you follow-up with your primary care doctor for further management of your diabetes.  Continue to take your insulin .

## 2024-06-25 NOTE — ED Notes (Signed)
 Blood Culture x1 at the lab.

## 2024-06-25 NOTE — ED Triage Notes (Signed)
 SOB and cough x3 days. Smoker. Course lung sounds bilaterally, diminished in bases.

## 2024-07-26 ENCOUNTER — Encounter (HOSPITAL_BASED_OUTPATIENT_CLINIC_OR_DEPARTMENT_OTHER): Payer: Self-pay | Admitting: Emergency Medicine

## 2024-07-26 ENCOUNTER — Emergency Department (HOSPITAL_BASED_OUTPATIENT_CLINIC_OR_DEPARTMENT_OTHER): Admitting: Radiology

## 2024-07-26 ENCOUNTER — Other Ambulatory Visit: Payer: Self-pay

## 2024-07-26 ENCOUNTER — Emergency Department (HOSPITAL_BASED_OUTPATIENT_CLINIC_OR_DEPARTMENT_OTHER)
Admission: EM | Admit: 2024-07-26 | Discharge: 2024-07-26 | Disposition: A | Discharge status: Home / Self Care | Attending: Emergency Medicine | Admitting: Emergency Medicine

## 2024-07-26 DIAGNOSIS — I10 Essential (primary) hypertension: Secondary | ICD-10-CM | POA: Diagnosis not present

## 2024-07-26 DIAGNOSIS — E1165 Type 2 diabetes mellitus with hyperglycemia: Secondary | ICD-10-CM | POA: Diagnosis not present

## 2024-07-26 DIAGNOSIS — J40 Bronchitis, not specified as acute or chronic: Secondary | ICD-10-CM | POA: Insufficient documentation

## 2024-07-26 DIAGNOSIS — Z7984 Long term (current) use of oral hypoglycemic drugs: Secondary | ICD-10-CM | POA: Diagnosis not present

## 2024-07-26 DIAGNOSIS — R059 Cough, unspecified: Secondary | ICD-10-CM | POA: Diagnosis present

## 2024-07-26 DIAGNOSIS — Z79899 Other long term (current) drug therapy: Secondary | ICD-10-CM | POA: Diagnosis not present

## 2024-07-26 DIAGNOSIS — Z794 Long term (current) use of insulin: Secondary | ICD-10-CM | POA: Insufficient documentation

## 2024-07-26 LAB — RESP PANEL BY RT-PCR (RSV, FLU A&B, COVID)  RVPGX2
Influenza A by PCR: NEGATIVE
Influenza B by PCR: NEGATIVE
Resp Syncytial Virus by PCR: NEGATIVE
SARS Coronavirus 2 by RT PCR: NEGATIVE

## 2024-07-26 LAB — CBG MONITORING, ED: Glucose-Capillary: 269 mg/dL — ABNORMAL HIGH (ref 70–99)

## 2024-07-26 MED ORDER — DOXYCYCLINE HYCLATE 100 MG PO CAPS: 100.0000 mg | ORAL_CAPSULE | Freq: Two times a day (BID) | ORAL | 0 refills | Status: AC

## 2024-07-26 MED ORDER — DOXYCYCLINE HYCLATE 100 MG PO TABS
100.0000 mg | ORAL_TABLET | Freq: Once | ORAL | Status: AC
  Administered 2024-07-26: 100 mg via ORAL
  Filled 2024-07-26: qty 1

## 2024-07-26 NOTE — Discharge Instructions (Signed)
 It is very important to take your regular medications as directed by your PCP.

## 2024-07-26 NOTE — ED Provider Notes (Signed)
 Jesse Whitehead   CSN: [id number] Arrival date & time: 07/26/24  0740     Patient presents with: Cough   Jesse Whitehead is a 64 y.o. male.   Pt is a 63 yo male with pmhx significant for DM, HTN, GERD, sleep apnea, and depression.  Pt presents to the ED today with SOB.  He's had cough and cold sx for a few days.  His wife is not sick.  He's not had fevers.  He did have pna last month and took his meds.  He has not been taking his diabetic meds and has not been checking his blood sugars.         Prior to Admission medications   Medication Sig Start Date End Date Taking? Authorizing Provider  doxycycline (VIBRAMYCIN) 100 MG capsule Take 1 capsule (100 mg total) by mouth 2 (two) times daily. 07/26/24  Yes Dean Clarity, MD  bacitracin  ointment Apply 1 Application topically 2 (two) times daily. 07/30/23   Charlyn Sora, MD  chlorthalidone (HYGROTON) 25 MG tablet Take 0.5 tablets by mouth daily. 10/18/20 06/18/21  [provider]  cyclobenzaprine  (FLEXERIL ) 10 MG tablet Take 1 tablet (10 mg total) by mouth 2 (two) times daily as needed for muscle spasms. 03/18/23   Silver Wonda LABOR, PA  DULoxetine (CYMBALTA) 30 MG capsule Take by mouth. 05/06/20   [provider]  gabapentin (NEURONTIN) 300 MG capsule TAKE ONE CAPSULE BY MOUTH EVERY MORNING AND AT NOON AND TAKE TWO CAPSULES AT BEDTIME 06/17/20 06/18/21  [provider]  gabapentin (NEURONTIN) 300 MG capsule Take 300 mg by mouth 3 (three) times daily.    [provider]  ibuprofen  (ADVIL ) 600 MG tablet Take 1 tablet (600 mg total) by mouth every 6 (six) hours as needed. 03/18/23   Silver Wonda LABOR, PA  insulin  glargine (LANTUS) 100 UNIT/ML injection INJECT 32 UNITS SUBCUTANEOUSLY AT BEDTIME FOR DIABETES (USE WITHIN 28 DAYS AFTER OPENING VIAL) 10/18/20 10/19/21  [provider]  insulin  regular (NOVOLIN R) 100 units/mL injection Inject 16 Units into  the skin 3 (three) times daily before meals.    [provider]  lidocaine  (LIDODERM ) 5 % Place 1 patch onto the skin daily. Remove & Discard patch within 12 hours or as directed by MD 12/12/20   Eudelia Maude SAUNDERS, PA-C  losartan (COZAAR) 50 MG tablet Take 25 mg by mouth daily.    [provider]  pantoprazole (PROTONIX) 40 MG tablet Take by mouth. 12/27/20   [provider]  Semaglutide,0.25 or 0.5MG /DOS, 2 MG/1.5ML SOPN Inject into the skin. 11/15/20   [provider]  triamcinolone  (NASACORT ) 55 MCG/ACT AERO nasal inhaler Place 2 sprays into the nose daily. 08/01/21   Graham, Laura E, PA-C    Allergies: Patient has no known allergies.    Review of Systems  HENT:  Positive for congestion.   Respiratory:  Positive for cough.   All other systems reviewed and are negative.   Updated Vital Signs BP (!) 148/97   Pulse (!) 108   Temp 98.8 F (37.1 C) (Oral)   Resp (!) 22   Ht 5' 8 (1.727 m)   Wt 126.1 kg   SpO2 95% Comment: RA  BMI 42.27 kg/m   Physical Exam Vitals and nursing Whitehead reviewed.  Constitutional:      Appearance: Normal appearance. He is obese.  HENT:     Head: Normocephalic and atraumatic.     Right Ear: External  ear normal.     Left Ear: External ear normal.     Nose: Rhinorrhea present.     Mouth/Throat:     Mouth: Mucous membranes are moist.     Pharynx: Oropharynx is clear.  Eyes:     Extraocular Movements: Extraocular movements intact.     Conjunctiva/sclera: Conjunctivae normal.     Pupils: Pupils are equal, round, and reactive to light.  Cardiovascular:     Rate and Rhythm: Regular rhythm. Tachycardia present.     Pulses: Normal pulses.     Heart sounds: Normal heart sounds.  Pulmonary:     Effort: Pulmonary effort is normal.     Breath sounds: Rhonchi present.  Abdominal:     General: Abdomen is flat. Bowel sounds are normal.     Palpations: Abdomen is soft.  Musculoskeletal:        General: Normal range of  motion.     Cervical back: Normal range of motion and neck supple.  Skin:    General: Skin is warm.     Capillary Refill: Capillary refill takes less than 2 seconds.  Neurological:     General: No focal deficit present.     Mental Status: He is alert and oriented to person, place, and time.  Psychiatric:        Mood and Affect: Mood normal.        Behavior: Behavior normal.     (all labs ordered are listed, but only abnormal results are displayed) Labs Reviewed  CBG MONITORING, ED - Abnormal; Notable for the following components:      Result Value   Glucose-Capillary 269 (*)    All other components within normal limits  RESP PANEL BY RT-PCR (RSV, FLU A&B, COVID)  RVPGX2    EKG: None  Radiology: DG Chest 2 View Result Date: 07/26/2024 CLINICAL DATA:  Cough and congestion EXAM: CHEST - 2 VIEW COMPARISON:  06/25/2024 FINDINGS: Normal heart size with similar vascular and interstitial prominence. No acute focal pneumonia, collapse or consolidation. Negative for CHF, large effusion or pneumothorax. Trachea midline. Similar tortuosity of the aorta. Degenerative changes throughout the spine. IMPRESSION: Similar vascular and interstitial prominence. No acute process by plain radiography. Electronically Signed   By: CHRISTELLA.  Shick M.D.   On: 07/26/2024 08:21     Procedures   Medications Ordered in the ED  doxycycline (VIBRA-TABS) tablet 100 mg (has no administration in time range)                                    Medical Decision Making Amount and/or Complexity of Data Reviewed Radiology: ordered.  Risk Prescription drug management.   This patient presents to the ED for concern of sob, this involves an extensive number of treatment options, and is a complaint that carries with it a high risk of complications and morbidity.  The differential diagnosis includes covid/flu/rsv; pna   Co morbidities that complicate the patient evaluation  DM, HTN, GERD, sleep apnea, and  depression   Additional history obtained:  Additional history obtained from epic chart review External records from outside source obtained and reviewed including wife   Lab Tests:  I Ordered, and personally interpreted labs.  The pertinent results include:  cbg 269; covid/flu/rsv neg   Imaging Studies ordered:  I ordered imaging studies including cxr  I independently visualized and interpreted imaging which showed  Similar vascular and interstitial prominence. No acute  process by  plain radiography.   I agree with the radiologist interpretation   Medicines ordered and prescription drug management:  I ordered medication including doxy  for sx  Reevaluation of the patient after these medicines showed that the patient improved I have reviewed the patients home medicines and have made adjustments as needed   Test Considered:  ct   Problem List / ED Course:  Cough/uri sx:  bronchitis.  Pt d/c with doxy.  He is saturating well and is afebrile.  He knows to return if worse.  F/u with pcp. Hyperglycemia:  pt has his meds.  He is encouraged to take them every day.    Reevaluation:  After the interventions noted above, I reevaluated the patient and found that they have :improved   Social Determinants of Health:  Lives at home   Dispostion:  After consideration of the diagnostic results and the patients response to treatment, I feel that the patent would benefit from discharge with outpatient f/u.       Final diagnoses:  Hyperglycemia due to diabetes mellitus (HCC)  Bronchitis    ED Discharge Orders          Ordered    doxycycline (VIBRAMYCIN) 100 MG capsule  2 times daily        07/26/24 9160               Dean Clarity, MD 07/26/24 0840

## 2024-07-26 NOTE — ED Triage Notes (Addendum)
 Pt caox4 c/o productive cough, sweating/chills, bodyaches, congestion, CP x2 days. Reports he was tx for pneumonia last month.

## 2024-07-26 NOTE — ED Notes (Signed)
 Patient transported to X-ray

## 2024-09-23 ENCOUNTER — Emergency Department (HOSPITAL_BASED_OUTPATIENT_CLINIC_OR_DEPARTMENT_OTHER)
Admission: EM | Admit: 2024-09-23 | Discharge: 2024-09-23 | Disposition: A | Discharge status: Home / Self Care | Attending: Emergency Medicine | Admitting: Emergency Medicine

## 2024-09-23 ENCOUNTER — Encounter (HOSPITAL_BASED_OUTPATIENT_CLINIC_OR_DEPARTMENT_OTHER): Payer: Self-pay

## 2024-09-23 ENCOUNTER — Other Ambulatory Visit: Payer: Self-pay

## 2024-09-23 ENCOUNTER — Emergency Department (HOSPITAL_BASED_OUTPATIENT_CLINIC_OR_DEPARTMENT_OTHER)

## 2024-09-23 DIAGNOSIS — E119 Type 2 diabetes mellitus without complications: Secondary | ICD-10-CM | POA: Insufficient documentation

## 2024-09-23 DIAGNOSIS — J069 Acute upper respiratory infection, unspecified: Secondary | ICD-10-CM | POA: Insufficient documentation

## 2024-09-23 DIAGNOSIS — Z794 Long term (current) use of insulin: Secondary | ICD-10-CM | POA: Diagnosis not present

## 2024-09-23 DIAGNOSIS — Z7984 Long term (current) use of oral hypoglycemic drugs: Secondary | ICD-10-CM | POA: Diagnosis not present

## 2024-09-23 DIAGNOSIS — R059 Cough, unspecified: Secondary | ICD-10-CM | POA: Diagnosis present

## 2024-09-23 MED ORDER — BENZONATATE 100 MG PO CAPS: 100.0000 mg | ORAL_CAPSULE | Freq: Three times a day (TID) | ORAL | 0 refills | Status: AC

## 2024-09-23 MED ORDER — ONDANSETRON 4 MG PO TBDP: ORAL_TABLET | ORAL | 0 refills | Status: AC

## 2024-09-23 MED ORDER — ACETAMINOPHEN 500 MG PO TABS
1000.0000 mg | ORAL_TABLET | Freq: Once | ORAL | Status: AC
  Administered 2024-09-23: 1000 mg via ORAL
  Filled 2024-09-23: qty 2

## 2024-09-23 NOTE — ED Triage Notes (Signed)
 Pt presents via POV c/o cough, congestion, generalized body aches, and SOB over the last few days. Reports difficulty sleeping due to symptoms.

## 2024-09-23 NOTE — Discharge Instructions (Signed)
 Take tylenol 2 pills 4 times a day and motrin 4 pills 3 times a day.  Drink plenty of fluids.  Return for worsening shortness of breath, headache, confusion. Follow up with your family doctor.

## 2024-09-23 NOTE — ED Provider Notes (Signed)
 " [place] EMERGENCY DEPARTMENT AT [facility] Provider Note   CSN: [id number] Arrival date & time: 09/23/24  0544     Patient presents with: URI   Jesse Whitehead is a 64 y.o. male.   64 yo M with a chief complaint of cough and congestion going on for the past 48 hours.  No known sick contacts no recent travel.  Has been having trouble sleeping due to the cough.  He does not want to take much medication because he is diabetic and is not sure what he can take.  Recently had pneumonia couple months ago.   URI      Prior to Admission medications  Medication Sig Start Date End Date Taking? Authorizing Provider  benzonatate  (TESSALON ) 100 MG capsule Take 1 capsule (100 mg total) by mouth every 8 (eight) hours. 09/23/24  Yes Emil Share, DO  ondansetron  (ZOFRAN -ODT) 4 MG disintegrating tablet 4mg  ODT q4 hours prn nausea/vomit 09/23/24  Yes Delonna Ney, DO  bacitracin  ointment Apply 1 Application topically 2 (two) times daily. 07/30/23   Charlyn Sora, MD  chlorthalidone (HYGROTON) 25 MG tablet Take 0.5 tablets by mouth daily. 10/18/20 06/18/21  [provider]  cyclobenzaprine  (FLEXERIL ) 10 MG tablet Take 1 tablet (10 mg total) by mouth 2 (two) times daily as needed for muscle spasms. 03/18/23   Silver Wonda LABOR, PA  doxycycline  (VIBRAMYCIN ) 100 MG capsule Take 1 capsule (100 mg total) by mouth 2 (two) times daily. 07/26/24   Haviland, Julie, MD  DULoxetine (CYMBALTA) 30 MG capsule Take by mouth. 05/06/20   [provider]  gabapentin (NEURONTIN) 300 MG capsule TAKE ONE CAPSULE BY MOUTH EVERY MORNING AND AT NOON AND TAKE TWO CAPSULES AT BEDTIME 06/17/20 06/18/21  [provider]  gabapentin (NEURONTIN) 300 MG capsule Take 300 mg by mouth 3 (three) times daily.    [provider]  ibuprofen  (ADVIL ) 600 MG tablet Take 1 tablet (600 mg total) by mouth every 6 (six) hours as needed. 03/18/23   Silver Wonda LABOR, PA  insulin  glargine (LANTUS) 100 UNIT/ML  injection INJECT 32 UNITS SUBCUTANEOUSLY AT BEDTIME FOR DIABETES (USE WITHIN 28 DAYS AFTER OPENING VIAL) 10/18/20 10/19/21  [provider]  insulin  regular (NOVOLIN R) 100 units/mL injection Inject 16 Units into the skin 3 (three) times daily before meals.    [provider]  lidocaine  (LIDODERM ) 5 % Place 1 patch onto the skin daily. Remove & Discard patch within 12 hours or as directed by MD 12/12/20   Eudelia Maude SAUNDERS, PA-C  losartan (COZAAR) 50 MG tablet Take 25 mg by mouth daily.    [provider]  pantoprazole (PROTONIX) 40 MG tablet Take by mouth. 12/27/20   [provider]  Semaglutide,0.25 or 0.5MG /DOS, 2 MG/1.5ML SOPN Inject into the skin. 11/15/20   [provider]  triamcinolone  (NASACORT ) 55 MCG/ACT AERO nasal inhaler Place 2 sprays into the nose daily. 08/01/21   Graham, Laura E, PA-C    Allergies: Patient has no known allergies.    Review of Systems  Updated Vital Signs BP (!) 154/114 (BP Location: Left Arm)   Pulse (!) 111   Temp 98.3 F (36.8 C) (Oral)   Resp (!) 22   SpO2 97%   Physical Exam Vitals and nursing note reviewed.  Constitutional:      Appearance: He is well-developed.  HENT:     Head: Normocephalic and atraumatic.  Eyes:     Pupils: Pupils are equal, round, and reactive to light.  Neck:     Vascular: No JVD.  Cardiovascular:     Rate and Rhythm: Normal rate and regular rhythm.     Heart sounds: No murmur heard.    No friction rub. No gallop.  Pulmonary:     Effort: No respiratory distress.     Breath sounds: No wheezing.     Comments: Coarse breath sounds in all fields Abdominal:     General: There is no distension.     Tenderness: There is no abdominal tenderness. There is no guarding or rebound.  Musculoskeletal:        General: Normal range of motion.     Cervical back: Normal range of motion and neck supple.  Skin:    Coloration: Skin is not pale.     Findings: No rash.  Neurological:      Mental Status: He is alert and oriented to person, place, and time.  Psychiatric:        Behavior: Behavior normal.     (all labs ordered are listed, but only abnormal results are displayed) Labs Reviewed - No data to display  EKG: EKG Interpretation Date/Time:  Wednesday September 23 2024 05:56:41 EST Ventricular Rate:  112 PR Interval:  140 QRS Duration:  82 QT Interval:  362 QTC Calculation: 494 R Axis:   59  Text Interpretation: Sinus tachycardia Biatrial enlargement Abnormal ECG No significant change since last tracing Confirmed by Emil Share [phone]) on 09/23/2024 6:02:50 AM  Radiology: [facility] Chest Portable 1 View Result Date: 09/23/2024 CLINICAL DATA:  Shortness of breath. EXAM: PORTABLE CHEST 1 VIEW COMPARISON:  07/26/2024 FINDINGS: The cardio pericardial silhouette is enlarged. Vascular congestion again noted with similar background interstitial opacity. No focal airspace consolidation. No pleural effusion. No acute bony abnormality. IMPRESSION: Enlargement of the cardiopericardial silhouette with vascular congestion. Component of interstitial edema not excluded. Electronically Signed   By: Camellia Candle M.D.   On: 09/23/2024 06:27     Procedures   Medications Ordered in the ED  acetaminophen  (TYLENOL ) tablet 1,000 mg (1,000 mg Oral Given 09/23/24 0630)                                    Medical Decision Making Amount and/or Complexity of Data Reviewed Radiology: ordered.  Risk OTC drugs. Prescription drug management.   64 yo M with cough and congestion going on for about 48 hours.  No known sick contacts.  No recent travel.  Patient is well-appearing nontoxic.  Not requiring oxygen.  Mild tachycardia here likely secondary to fever.  Chest x-ray on my independent interpretation without obvious focal infiltrate or pneumothorax.  Will treat as viral syndrome.  PCP follow-up.  6:33 AM:  I have discussed the diagnosis/risks/treatment options with the patient.  Evaluation  and diagnostic testing in the emergency department does not suggest an emergent condition requiring admission or immediate intervention beyond what has been performed at this time.  They will follow up with PCP. We also discussed returning to the ED immediately if new or worsening sx occur. We discussed the sx which are most concerning (e.g., sudden worsening pain, fever, inability to tolerate by mouth) that necessitate immediate return. Medications administered to the patient during their visit and any new prescriptions provided to the patient are listed below.  Medications given during this visit Medications  acetaminophen  (TYLENOL ) tablet 1,000 mg (1,000 mg Oral Given 09/23/24 0630)     The patient  appears reasonably screen and/or stabilized for discharge and I doubt any other medical condition or other [facility] requiring further screening, evaluation, or treatment in the ED at this time prior to discharge.       Final diagnoses:  Viral URI with cough    ED Discharge Orders          Ordered    benzonatate  (TESSALON ) 100 MG capsule  Every 8 hours        09/23/24 0632    ondansetron  (ZOFRAN -ODT) 4 MG disintegrating tablet        09/23/24 0632               Emil Share, DO 09/23/24 9366  "

## 2024-10-06 ENCOUNTER — Emergency Department (HOSPITAL_BASED_OUTPATIENT_CLINIC_OR_DEPARTMENT_OTHER)

## 2024-10-06 ENCOUNTER — Emergency Department (HOSPITAL_BASED_OUTPATIENT_CLINIC_OR_DEPARTMENT_OTHER)
Admission: EM | Admit: 2024-10-06 | Discharge: 2024-10-06 | Disposition: A | Discharge status: Home / Self Care | Attending: Emergency Medicine | Admitting: Emergency Medicine

## 2024-10-06 ENCOUNTER — Encounter (HOSPITAL_BASED_OUTPATIENT_CLINIC_OR_DEPARTMENT_OTHER): Payer: Self-pay | Admitting: Emergency Medicine

## 2024-10-06 ENCOUNTER — Other Ambulatory Visit: Payer: Self-pay

## 2024-10-06 DIAGNOSIS — Z794 Long term (current) use of insulin: Secondary | ICD-10-CM | POA: Diagnosis not present

## 2024-10-06 DIAGNOSIS — I10 Essential (primary) hypertension: Secondary | ICD-10-CM | POA: Insufficient documentation

## 2024-10-06 DIAGNOSIS — R7401 Elevation of levels of liver transaminase levels: Secondary | ICD-10-CM | POA: Insufficient documentation

## 2024-10-06 DIAGNOSIS — J209 Acute bronchitis, unspecified: Secondary | ICD-10-CM | POA: Insufficient documentation

## 2024-10-06 DIAGNOSIS — Z79899 Other long term (current) drug therapy: Secondary | ICD-10-CM | POA: Diagnosis not present

## 2024-10-06 DIAGNOSIS — E119 Type 2 diabetes mellitus without complications: Secondary | ICD-10-CM | POA: Insufficient documentation

## 2024-10-06 DIAGNOSIS — R0602 Shortness of breath: Secondary | ICD-10-CM | POA: Diagnosis present

## 2024-10-06 DIAGNOSIS — R748 Abnormal levels of other serum enzymes: Secondary | ICD-10-CM | POA: Insufficient documentation

## 2024-10-06 LAB — COMPREHENSIVE METABOLIC PANEL WITH GFR
ALT: 78 U/L — ABNORMAL HIGH (ref 0–44)
AST: 72 U/L — ABNORMAL HIGH (ref 15–41)
Albumin: 4 g/dL (ref 3.5–5.0)
Alkaline Phosphatase: 166 U/L — ABNORMAL HIGH (ref 38–126)
Anion gap: 11 (ref 5–15)
BUN: 12 mg/dL (ref 8–23)
CO2: 27 mmol/L (ref 22–32)
Calcium: 9.2 mg/dL (ref 8.9–10.3)
Chloride: 99 mmol/L (ref 98–111)
Creatinine, Ser: 0.95 mg/dL (ref 0.61–1.24)
GFR, Estimated: >60 mL/min
Glucose, Bld: 343 mg/dL — ABNORMAL HIGH (ref 70–99)
Potassium: 4.4 mmol/L (ref 3.5–5.1)
Sodium: 137 mmol/L (ref 135–145)
Total Bilirubin: 0.8 mg/dL (ref 0.0–1.2)
Total Protein: 6.6 g/dL (ref 6.5–8.1)

## 2024-10-06 LAB — CBC
HCT: 44.7 % (ref 39.0–52.0)
Hemoglobin: 14.5 g/dL (ref 13.0–17.0)
MCH: 30.7 pg (ref 26.0–34.0)
MCHC: 32.4 g/dL (ref 30.0–36.0)
MCV: 94.5 fL (ref 80.0–100.0)
Platelets: 209 K/uL (ref 150–400)
RBC: 4.73 MIL/uL (ref 4.22–5.81)
RDW: 13.8 % (ref 11.5–15.5)
WBC: 8.9 K/uL (ref 4.0–10.5)
nRBC: 0 % (ref 0.0–0.2)

## 2024-10-06 LAB — TROPONIN T, HIGH SENSITIVITY
Troponin T High Sensitivity: 17 ng/L (ref 0–19)
Troponin T High Sensitivity: 16 ng/L (ref 0–19)

## 2024-10-06 MED ORDER — PREDNISONE 20 MG PO TABS
40.0000 mg | ORAL_TABLET | Freq: Once | ORAL | Status: AC
  Administered 2024-10-06: 40 mg via ORAL
  Filled 2024-10-06: qty 2

## 2024-10-06 MED ORDER — ACETAMINOPHEN 325 MG PO TABS
650.0000 mg | ORAL_TABLET | Freq: Once | ORAL | Status: AC
  Administered 2024-10-06: 650 mg via ORAL
  Filled 2024-10-06: qty 2

## 2024-10-06 MED ORDER — ALBUTEROL SULFATE HFA 108 (90 BASE) MCG/ACT IN AERS: 2.0000 | INHALATION_SPRAY | RESPIRATORY_TRACT | Status: AC | PRN

## 2024-10-06 MED ORDER — ALBUTEROL SULFATE HFA 108 (90 BASE) MCG/ACT IN AERS
2.0000 | INHALATION_SPRAY | RESPIRATORY_TRACT | Status: DC | PRN
  Administered 2024-10-06: 2 via RESPIRATORY_TRACT
  Filled 2024-10-06: qty 6.7

## 2024-10-06 MED ORDER — IBUPROFEN 400 MG PO TABS
400.0000 mg | ORAL_TABLET | Freq: Once | ORAL | Status: AC
  Administered 2024-10-06: 400 mg via ORAL
  Filled 2024-10-06: qty 1

## 2024-10-06 MED ORDER — AEROCHAMBER PLUS FLO-VU MEDIUM MISC
1.0000 | Freq: Once | Status: AC
  Administered 2024-10-06: 1

## 2024-10-06 MED ORDER — PREDNISONE 20 MG PO TABS: ORAL_TABLET | ORAL | 0 refills | Status: AC

## 2024-10-06 MED ORDER — IPRATROPIUM-ALBUTEROL 0.5-2.5 (3) MG/3ML IN SOLN
3.0000 mL | Freq: Once | RESPIRATORY_TRACT | Status: AC
  Administered 2024-10-06: 3 mL via RESPIRATORY_TRACT
  Filled 2024-10-06: qty 3

## 2024-10-06 NOTE — ED Triage Notes (Addendum)
 Patient sob and cough for days. Patient reports he feels like something is sitting on his chest and his symptoms are worse when he lays down and tries to go to sleep. Patient reports history of sleep apnea, reports compliance with cpap.

## 2024-10-06 NOTE — ED Notes (Signed)
 Pt ambulated to bathroom independently, states he had a BM.

## 2024-10-06 NOTE — ED Notes (Signed)
 Patient provided with inhaler and spacer. Indications of use discussed with patient. Correct spacer usage demonstrated with patient. Patient able to perform independently.

## 2024-10-06 NOTE — Discharge Instructions (Signed)
 Use the inhaler-2 puffs at a time, every 4-6 hours-as needed for coughing or shortness of breath or wheezing.  Make sure you are drinking enough fluids.  Return to the emergency department if symptoms or not being adequately controlled at home.

## 2024-10-06 NOTE — ED Provider Notes (Signed)
 " [place] EMERGENCY DEPARTMENT AT [facility] Provider Note   CSN: [id number] Arrival date & time: 10/06/24  9677     Patient presents with: Shortness of Breath and Chest Pain   Jesse Whitehead is a 64 y.o. male.   The history is provided by the patient.  Shortness of Breath Associated symptoms: chest pain   Chest Pain Associated symptoms: shortness of breath    He has history of hypertension, diabetes and comes in with a 3-day history of bodyaches, chills, sweats, cough productive of small amount of white sputum.  He denies fever.  He denies vomiting or diarrhea.  He does complain of being short of breath.  He has had sick contacts.  He has not had the influenza immunization.    Prior to Admission medications  Medication Sig Start Date End Date Taking? Authorizing Provider  bacitracin  ointment Apply 1 Application topically 2 (two) times daily. 07/30/23   Charlyn Sora, MD  benzonatate  (TESSALON ) 100 MG capsule Take 1 capsule (100 mg total) by mouth every 8 (eight) hours. 09/23/24   Emil Share, DO  chlorthalidone (HYGROTON) 25 MG tablet Take 0.5 tablets by mouth daily. 10/18/20 06/18/21  [provider]  cyclobenzaprine  (FLEXERIL ) 10 MG tablet Take 1 tablet (10 mg total) by mouth 2 (two) times daily as needed for muscle spasms. 03/18/23   Silver Fell A, PA  doxycycline  (VIBRAMYCIN ) 100 MG capsule Take 1 capsule (100 mg total) by mouth 2 (two) times daily. 07/26/24   Haviland, Julie, MD  DULoxetine (CYMBALTA) 30 MG capsule Take by mouth. 05/06/20   [provider]  gabapentin (NEURONTIN) 300 MG capsule TAKE ONE CAPSULE BY MOUTH EVERY MORNING AND AT NOON AND TAKE TWO CAPSULES AT BEDTIME 06/17/20 06/18/21  [provider]  gabapentin (NEURONTIN) 300 MG capsule Take 300 mg by mouth 3 (three) times daily.    [provider]  ibuprofen  (ADVIL ) 600 MG tablet Take 1 tablet (600 mg total) by mouth every 6 (six) hours as needed. 03/18/23   Silver Fell LABOR, PA  insulin  glargine (LANTUS) 100 UNIT/ML injection INJECT 32 UNITS SUBCUTANEOUSLY AT BEDTIME FOR DIABETES (USE WITHIN 28 DAYS AFTER OPENING VIAL) 10/18/20 10/19/21  [provider]  insulin  regular (NOVOLIN R) 100 units/mL injection Inject 16 Units into the skin 3 (three) times daily before meals.    [provider]  lidocaine  (LIDODERM ) 5 % Place 1 patch onto the skin daily. Remove & Discard patch within 12 hours or as directed by MD 12/12/20   Eudelia Maude SAUNDERS, PA-C  losartan (COZAAR) 50 MG tablet Take 25 mg by mouth daily.    [provider]  ondansetron  (ZOFRAN -ODT) 4 MG disintegrating tablet 4mg  ODT q4 hours prn nausea/vomit 09/23/24   Floyd, Dan, DO  pantoprazole (PROTONIX) 40 MG tablet Take by mouth. 12/27/20   [provider]  Semaglutide,0.25 or 0.5MG /DOS, 2 MG/1.5ML SOPN Inject into the skin. 11/15/20   [provider]  triamcinolone  (NASACORT ) 55 MCG/ACT AERO nasal inhaler Place 2 sprays into the nose daily. 08/01/21   Graham, Laura E, PA-C    Allergies: Patient has no known allergies.    Review of Systems  Respiratory:  Positive for shortness of breath.   Cardiovascular:  Positive for chest pain.  All other systems reviewed and are negative.   Updated Vital Signs BP (!) 171/100   Pulse (!) 101   Temp 98 F (36.7 C) (Oral)   Resp (!) 22   Wt 126 kg  SpO2 94%   BMI 42.24 kg/m   Physical Exam Vitals and nursing note reviewed.   64 year old male, resting comfortably and in no acute distress. Vital signs are significant for elevated blood pressure, borderline elevated heart rate and borderline elevated respiratory rate. Oxygen saturation is 94%, which is normal. Head is normocephalic and atraumatic. PERRLA, EOMI.  Neck is nontender and supple without adenopathy. Lungs are clear without rales, wheezes, or rhonchi.  There is a slight prolonged exhalation phase and slight wheezing is noted with forced exhalation. Chest is  nontender. Heart has regular rate and rhythm without murmur. Abdomen is soft, flat, nontender. Extremities have no cyanosis or edema, full range of motion is present. Skin is warm and dry without rash. Neurologic: Mental status is normal, cranial nerves are intact, moves all extremities equally.  (all labs ordered are listed, but only abnormal results are displayed) Labs Reviewed  COMPREHENSIVE METABOLIC PANEL WITH GFR - Abnormal; Notable for the following components:      Result Value   Glucose, Bld 343 (*)    AST 72 (*)    ALT 78 (*)    Alkaline Phosphatase 166 (*)    All other components within normal limits  CBC  TROPONIN T, HIGH SENSITIVITY  TROPONIN T, HIGH SENSITIVITY    EKG: EKG Interpretation Date/Time:  Tuesday October 06 2024 03:40:25 EST Ventricular Rate:  98 PR Interval:  175 QRS Duration:  93 QT Interval:  399 QTC Calculation: 510 R Axis:   44  Text Interpretation: Sinus rhythm Biatrial enlargement Nonspecific T abnormalities, lateral leads Prolonged QT interval When compared with ECG of 09/23/2024, No significant change was found Confirmed by Raford Lenis ([id number]) on 10/06/2024 4:15:53 AM  Radiology: [facility] Chest Port 1 View Result Date: 10/06/2024 EXAM: 1 VIEW(S) XRAY OF THE CHEST 10/06/2024 03:51:53 AM COMPARISON: 09/23/2024 CLINICAL HISTORY: SOB (shortness of breath) FINDINGS: LUNGS AND PLEURA: Increased interstitial markings without frank interstitial edema. No focal pulmonary opacity. No pleural effusion. No pneumothorax. HEART AND MEDIASTINUM: Cardiomegaly. No acute abnormality of the mediastinal silhouette. BONES AND SOFT TISSUES: No acute osseous abnormality. IMPRESSION: 1. Cardiomegaly. No frank interstitial edema. Electronically signed by: Pinkie Pebbles MD 10/06/2024 03:58 AM EST RP Workstation: [id number]     Procedures   Medications Ordered in the ED  predniSONE  (DELTASONE ) tablet 40 mg (has no administration in time range)  albuterol  (VENTOLIN  HFA)  108 (90 Base) MCG/ACT inhaler 2 puff (has no administration in time range)  AeroChamber Plus Flo-Vu Medium MISC 1 each (has no administration in time range)  ipratropium-albuterol  (DUONEB) 0.5-2.5 (3) MG/3ML nebulizer solution 3 mL (3 mLs Nebulization Given 10/06/24 0426)  ibuprofen  (ADVIL ) tablet 400 mg (400 mg Oral Given 10/06/24 0448)  acetaminophen  (TYLENOL ) tablet 650 mg (650 mg Oral Given 10/06/24 0448)                                    Medical Decision Making Amount and/or Complexity of Data Reviewed Labs: ordered. Radiology: ordered.  Risk OTC drugs. Prescription drug management.   Cough, chills, body aches strongly suggestive of viral illness such as influenza or COVID-19 or RSV.  Rule out pneumonia.  I have reviewed his laboratory tests, my interpretation is elevated glucose consistent with known history of diabetes, elevated alkaline phosphatase which is new compared with 03/18/2023, mild elevation of transaminases new compared with 03/18/2023, normal CBC, normal troponin.  Chest x-ray shows cardiomegaly but no  evidence of pneumonia.  I have independently viewed the image, and agree with the radiologist's interpretation.  He is outside of the window to initiate treatment for influenza and does not have significant comorbidities to warrant antiviral treatment for COVID-19 so viral respiratory pathogen panel was not obtained.  I have ordered nebulizer treatment with albuterol  and ipratropium.  Following above-noted treatment, patient states that he feels much better.  I have ordered a dose of prednisone  and I am discharging him with an albuterol  inhaler and a prescription for prednisone .  Follow-up with PCP.  Return precautions discussed.     Final diagnoses:  Acute bronchitis, unspecified organism  Elevated alkaline phosphatase level  Elevated transaminase level    ED Discharge Orders          Ordered    predniSONE  (DELTASONE ) 20 MG tablet        10/06/24 0622    albuterol   (VENTOLIN  HFA) 108 (90 Base) MCG/ACT inhaler  Every 4 hours PRN        10/06/24 0622               Raford Lenis, MD 10/06/24 [phone]  "

## 2024-10-06 NOTE — ED Notes (Signed)
Patient verbalizes understanding of discharge instructions. Opportunity for questioning and answers were provided. Armband removed by staff, pt discharged from ED. Ambulated out to lobby, awaiting ride home
